# Patient Record
Sex: Male | Born: 1945 | Race: Black or African American | Hispanic: No | Smoking: Never smoker
Health system: Southern US, Community
[De-identification: ages and names within clinical notes are randomized; demographics above are authoritative.]

## PROBLEM LIST (undated history)

## (undated) DIAGNOSIS — E876 Hypokalemia: Secondary | ICD-10-CM

## (undated) DIAGNOSIS — H409 Unspecified glaucoma: Secondary | ICD-10-CM

## (undated) DIAGNOSIS — R319 Hematuria, unspecified: Secondary | ICD-10-CM

## (undated) DIAGNOSIS — E785 Hyperlipidemia, unspecified: Secondary | ICD-10-CM

## (undated) DIAGNOSIS — N529 Male erectile dysfunction, unspecified: Secondary | ICD-10-CM

## (undated) DIAGNOSIS — C679 Malignant neoplasm of bladder, unspecified: Secondary | ICD-10-CM

## (undated) DIAGNOSIS — I10 Essential (primary) hypertension: Secondary | ICD-10-CM

## (undated) HISTORY — PX: EYE SURGERY: SHX253

---

## 1978-01-22 HISTORY — PX: KNEE SURGERY: SHX244

## 2009-01-22 DEATH — deceased

## 2011-11-16 DIAGNOSIS — E785 Hyperlipidemia, unspecified: Secondary | ICD-10-CM | POA: Insufficient documentation

## 2011-11-16 DIAGNOSIS — I10 Essential (primary) hypertension: Secondary | ICD-10-CM | POA: Insufficient documentation

## 2011-12-18 DIAGNOSIS — N529 Male erectile dysfunction, unspecified: Secondary | ICD-10-CM | POA: Insufficient documentation

## 2012-09-22 DEATH — deceased

## 2014-05-28 DIAGNOSIS — H40052 Ocular hypertension, left eye: Secondary | ICD-10-CM | POA: Insufficient documentation

## 2015-03-03 DIAGNOSIS — Z2821 Immunization not carried out because of patient refusal: Secondary | ICD-10-CM | POA: Insufficient documentation

## 2015-03-09 DIAGNOSIS — E876 Hypokalemia: Secondary | ICD-10-CM | POA: Insufficient documentation

## 2016-09-14 ENCOUNTER — Encounter: Payer: Self-pay | Admitting: *Deleted

## 2016-09-17 ENCOUNTER — Ambulatory Visit: Payer: Medicare Other | Admitting: Anesthesiology

## 2016-09-17 ENCOUNTER — Encounter
Admission: RE | Disposition: A | Discharge status: Home / Self Care | Payer: Self-pay | Source: Ambulatory Visit | Attending: Gastroenterology

## 2016-09-17 ENCOUNTER — Encounter: Payer: Self-pay | Admitting: Anesthesiology

## 2016-09-17 ENCOUNTER — Ambulatory Visit
Admission: RE | Admit: 2016-09-17 | Discharge: 2016-09-17 | Disposition: A | Discharge status: Home / Self Care | Payer: Medicare Other | Source: Ambulatory Visit | Attending: Gastroenterology | Admitting: Gastroenterology

## 2016-09-17 DIAGNOSIS — K5730 Diverticulosis of large intestine without perforation or abscess without bleeding: Secondary | ICD-10-CM | POA: Insufficient documentation

## 2016-09-17 DIAGNOSIS — I10 Essential (primary) hypertension: Secondary | ICD-10-CM | POA: Diagnosis not present

## 2016-09-17 DIAGNOSIS — Z86010 Personal history of colonic polyps: Secondary | ICD-10-CM | POA: Diagnosis not present

## 2016-09-17 DIAGNOSIS — K635 Polyp of colon: Secondary | ICD-10-CM | POA: Insufficient documentation

## 2016-09-17 DIAGNOSIS — Z79899 Other long term (current) drug therapy: Secondary | ICD-10-CM | POA: Diagnosis not present

## 2016-09-17 DIAGNOSIS — H409 Unspecified glaucoma: Secondary | ICD-10-CM | POA: Insufficient documentation

## 2016-09-17 DIAGNOSIS — N529 Male erectile dysfunction, unspecified: Secondary | ICD-10-CM | POA: Insufficient documentation

## 2016-09-17 DIAGNOSIS — D124 Benign neoplasm of descending colon: Secondary | ICD-10-CM | POA: Diagnosis not present

## 2016-09-17 DIAGNOSIS — K640 First degree hemorrhoids: Secondary | ICD-10-CM | POA: Insufficient documentation

## 2016-09-17 DIAGNOSIS — Z1211 Encounter for screening for malignant neoplasm of colon: Secondary | ICD-10-CM | POA: Diagnosis not present

## 2016-09-17 HISTORY — PX: COLONOSCOPY WITH PROPOFOL: SHX5780

## 2016-09-17 HISTORY — DX: Hypokalemia: E87.6

## 2016-09-17 HISTORY — DX: Unspecified glaucoma: H40.9

## 2016-09-17 HISTORY — DX: Hyperlipidemia, unspecified: E78.5

## 2016-09-17 HISTORY — DX: Essential (primary) hypertension: I10

## 2016-09-17 HISTORY — DX: Male erectile dysfunction, unspecified: N52.9

## 2016-09-17 SURGERY — COLONOSCOPY WITH PROPOFOL
Anesthesia: General

## 2016-09-17 MED ORDER — PROPOFOL 500 MG/50ML IV EMUL
INTRAVENOUS | Status: DC | PRN
  Administered 2016-09-17: 150 ug/kg/min via INTRAVENOUS

## 2016-09-17 MED ORDER — SODIUM CHLORIDE 0.9 % IV SOLN: INTRAVENOUS | Status: DC

## 2016-09-17 MED ORDER — PROPOFOL 10 MG/ML IV BOLUS
INTRAVENOUS | Status: DC | PRN
  Administered 2016-09-17: 80 mg via INTRAVENOUS

## 2016-09-17 MED ORDER — SODIUM CHLORIDE 0.9 % IV SOLN
INTRAVENOUS | Status: DC
  Administered 2016-09-17 (×2): via INTRAVENOUS

## 2016-09-17 MED ORDER — PROPOFOL 500 MG/50ML IV EMUL
INTRAVENOUS | Status: AC
  Filled 2016-09-17: qty 50

## 2016-09-17 NOTE — Transfer of Care (Signed)
Immediate Anesthesia Transfer of Care Note  Patient: Shawn Hardy  Procedure(s) Performed: Procedure(s): COLONOSCOPY WITH PROPOFOL (N/A)  Patient Location: PACU  Anesthesia Type:General  Level of Consciousness: sedated  Airway & Oxygen Therapy: Patient Spontanous Breathing and Patient connected to nasal cannula oxygen  Post-op Assessment: Report given to RN and Post -op Vital signs reviewed and stable  Post vital signs: Reviewed and stable  Last Vitals:  Vitals:   09/17/16 0913  BP: (!) 161/94  Pulse: 86  Resp: 16  Temp: (!) 36.1 C  SpO2: 98%    Last Pain:  Vitals:   09/17/16 0913  TempSrc: Tympanic         Complications: No apparent anesthesia complications

## 2016-09-17 NOTE — Anesthesia Procedure Notes (Signed)
Date/Time: 09/17/2016 10:15 AM Performed by: Nelda Marseille Pre-anesthesia Checklist: Patient identified, Emergency Drugs available, Suction available, Patient being monitored and Timeout performed Oxygen Delivery Method: Nasal cannula

## 2016-09-17 NOTE — H&P (Signed)
Outpatient short stay form Pre-procedure 09/17/2016 9:56 AM Lollie Sails MD  Primary Physician: Demetrio Lapping, D.O.  Reason for visit:  Colonoscopy  History of present illness:  Patient is a 71 year old male presenting today as above. He tolerated his prep well. He takes no aspirin or blood thinning agents.    Current Facility-Administered Medications:  .  0.9 %  sodium chloride infusion, , Intravenous, Continuous, Lollie Sails, MD, Last Rate: 20 mL/hr at 09/17/16 0929 .  0.9 %  sodium chloride infusion, , Intravenous, Continuous, Lollie Sails, MD  Prescriptions Prior to Admission  Medication Sig Dispense Refill Last Dose  . amLODipine (NORVASC) 10 MG tablet Take 10 mg by mouth daily.   09/16/2016 at Unknown time  . atropine (ATROPINE-CARE) 1 % ophthalmic solution Place 1 drop into the left eye 2 (two) times daily.   09/16/2016 at Unknown time  . benazepril (LOTENSIN) 20 MG tablet Take 20 mg by mouth daily.   09/16/2016 at Unknown time  . brimonidine-timolol (COMBIGAN) 0.2-0.5 % ophthalmic solution Place 1 drop into the right eye 2 (two) times daily.   09/16/2016 at Unknown time  . Difluprednate (DUREZOL) 0.05 % EMUL Place 1 drop into the left eye 2 (two) times daily.   09/16/2016 at Unknown time  . Multiple Vitamins-Minerals (CENTRUM SILVER PO) Take 1 tablet by mouth daily.   Past Week at Unknown time  . potassium chloride (KLOR-CON) 20 MEQ packet Take 20 mEq by mouth 2 (two) times daily.   Past Week at Unknown time  . sildenafil (REVATIO) 20 MG tablet Take 20 mg by mouth as needed. Take 3-4 tablets 2 hours prior to sexual activity.     Marland Kitchen triamterene-hydrochlorothiazide (DYAZIDE) 37.5-25 MG capsule Take 1 capsule by mouth daily.   09/16/2016 at Unknown time     No Known Allergies   Past Medical History:  Diagnosis Date  . ED (erectile dysfunction)   . Glaucoma   . Hyperlipidemia   . Hypertension   . Hypokalemia     Review of systems:      Physical Exam   Heart and lungs: Regular rate and rhythm without rub or gallop, lungs are bilaterally clear.    HEENT: Normocephalic atraumatic eyes are anicteric    Other:     Pertinant exam for procedure: Soft nontender nondistended bowel sounds positive normoactive.    Planned proceedures: Colonoscopy and indicated procedures. I have discussed the risks benefits and complications of procedures to include not limited to bleeding, infection, perforation and the risk of sedation and the patient wishes to proceed.    Lollie Sails, MD Gastroenterology 09/17/2016  9:56 AM

## 2016-09-17 NOTE — Anesthesia Postprocedure Evaluation (Signed)
Anesthesia Post Note  Patient: Shawn Hardy  Procedure(s) Performed: Procedure(s) (LRB): COLONOSCOPY WITH PROPOFOL (N/A)  Patient location during evaluation: PACU Anesthesia Type: General Level of consciousness: awake and alert and oriented Pain management: pain level controlled Vital Signs Assessment: post-procedure vital signs reviewed and stable Respiratory status: spontaneous breathing Cardiovascular status: blood pressure returned to baseline Anesthetic complications: no     Last Vitals:  Vitals:   09/17/16 1057 09/17/16 1107  BP: 125/84 131/79  Pulse: 80 69  Resp: 11 15  Temp:    SpO2: 100% 99%    Last Pain:  Vitals:   09/17/16 1037  TempSrc: Tympanic                 Gianluca Chhim

## 2016-09-17 NOTE — Op Note (Signed)
[facility] Gastroenterology Patient Name: Shawn Hardy Procedure Date: 09/17/2016 10:04 AM MRN: [id number] Account #: [id number] Date of Birth: [birth year] Admit Type: Outpatient Age: 71 Room: [facility] ENDO ROOM 3 Gender: Male Note Status: Finalized Procedure:            Colonoscopy Indications:          Personal history of colonic polyps Providers:            Lollie Sails, MD Referring MD:         Irven Easterly. Kary Kos, MD (Referring MD) Medicines:            Monitored Anesthesia Care Complications:        No immediate complications. Procedure:            Pre-Anesthesia Assessment:                       - ASA Grade Assessment: II - A patient with mild                        systemic disease.                       After obtaining informed consent, the colonoscope was                        passed under direct vision. Throughout the procedure,                        the patient's blood pressure, pulse, and oxygen                        saturations were monitored continuously. The                        Colonoscope was introduced through the anus and                        advanced to the the cecum, identified by appendiceal                        orifice and ileocecal valve. The colonoscopy was                        performed with moderate difficulty due to poor bowel                        prep. Successful completion of the procedure was aided                        by lavage. The quality of the bowel preparation was                        fair. Findings:      Two sessile polyps were found in the descending colon. The polyps were       10 to 11 mm in size. These polyps were removed with a cold snare.       Resection and retrieval were complete.      A 2 mm polyp was found in the mid ascending colon. The polyp was       sessile. The polyp was removed  with a cold biopsy forceps. Resection and       retrieval were complete.      Multiple small-mouthed diverticula  were found in the sigmoid colon,       descending colon, transverse colon and ascending colon.      Non-bleeding internal hemorrhoids were found during retroflexion, during       digital exam and during anoscopy. The hemorrhoids were medium-sized and       Grade I (internal hemorrhoids that do not prolapse). Impression:           - Preparation of the colon was fair.                       - Two 10 to 11 mm polyps in the descending colon,                        removed with a cold snare. Resected and retrieved.                       - One 2 mm polyp in the mid ascending colon, removed                        with a cold biopsy forceps. Resected and retrieved.                       - Diverticulosis in the sigmoid colon, in the                        descending colon, in the transverse colon and in the                        ascending colon.                       - Non-bleeding internal hemorrhoids. Recommendation:       - Await pathology results.                       - Telephone GI clinic for pathology results in 1 week. Procedure Code(s):    --- Professional ---                       [phone], Colonoscopy, flexible; with removal of tumor(s),                        polyp(s), or other lesion(s) by snare technique                       45380, 15, Colonoscopy, flexible; with biopsy, single                        or multiple Diagnosis Code(s):    --- Professional ---                       K64.0, First degree hemorrhoids                       D12.4, Benign neoplasm of descending colon                       D12.2, Benign neoplasm of ascending colon  Z86.010, Personal history of colonic polyps                       K57.30, Diverticulosis of large intestine without                        perforation or abscess without bleeding CPT copyright 2016 American Medical Association. All rights reserved. The codes documented in this report are preliminary and upon coder review may  be  revised to meet current compliance requirements. Lollie Sails, MD 09/17/2016 10:37:12 AM This report has been signed electronically. Number of Addenda: 0 Note Initiated On: 09/17/2016 10:04 AM Scope Withdrawal Time: 0 hours 9 minutes 48 seconds  Total Procedure Duration: 0 hours 20 minutes 43 seconds       [facility]

## 2016-09-17 NOTE — Anesthesia Preprocedure Evaluation (Signed)
Anesthesia Evaluation  Patient identified by MRN, date of birth, ID band Patient awake    Reviewed: Allergy & Precautions, NPO status , Patient's Chart, lab work & pertinent test results  Airway Mallampati: II  TM Distance: >3 FB     Dental  (+) Teeth Intact   Pulmonary neg pulmonary ROS,    Pulmonary exam normal        Cardiovascular hypertension, Pt. on medications Normal cardiovascular exam     Neuro/Psych negative neurological ROS  negative psych ROS   GI/Hepatic negative GI ROS, Neg liver ROS,   Endo/Other  negative endocrine ROS  Renal/GU negative Renal ROS     Musculoskeletal negative musculoskeletal ROS (+)   Abdominal Normal abdominal exam  (+)   Peds negative pediatric ROS (+)  Hematology negative hematology ROS (+)   Anesthesia Other Findings Past Medical History: No date: ED (erectile dysfunction) No date: Glaucoma No date: Hyperlipidemia No date: Hypertension No date: Hypokalemia  Reproductive/Obstetrics                             Anesthesia Physical Anesthesia Plan  ASA: II  Anesthesia Plan: General   Post-op Pain Management:    Induction: Intravenous  PONV Risk Score and Plan:   Airway Management Planned: Nasal Cannula  Additional Equipment:   Intra-op Plan:   Post-operative Plan:   Informed Consent: I have reviewed the patients History and Physical, chart, labs and discussed the procedure including the risks, benefits and alternatives for the proposed anesthesia with the patient or authorized representative who has indicated his/her understanding and acceptance.   Dental advisory given  Plan Discussed with: CRNA and Surgeon  Anesthesia Plan Comments:         Anesthesia Quick Evaluation

## 2016-09-17 NOTE — Anesthesia Post-op Follow-up Note (Signed)
Anesthesia QCDR form completed.        

## 2016-09-18 ENCOUNTER — Encounter: Payer: Self-pay | Admitting: Gastroenterology

## 2016-09-18 LAB — SURGICAL PATHOLOGY

## 2017-09-09 DIAGNOSIS — H2510 Age-related nuclear cataract, unspecified eye: Secondary | ICD-10-CM | POA: Insufficient documentation

## 2017-09-09 DIAGNOSIS — H401190 Primary open-angle glaucoma, unspecified eye, stage unspecified: Secondary | ICD-10-CM | POA: Insufficient documentation

## 2017-09-09 DIAGNOSIS — H179 Unspecified corneal scar and opacity: Secondary | ICD-10-CM | POA: Insufficient documentation

## 2017-09-09 NOTE — Progress Notes (Signed)
09/10/2017 9:07 AM   Shawn Hardy [birth year] [id number]  Referring provider: Maryland Pink, MD [address] [facility] [place], [place] [address]  Chief Complaint  Patient presents with  . Hematuria    HPI: Patient is a 72 -year-old [place] American male who presents today as a referral from Dr. Maryland Pink for gross hematuria.    He states one year ago he was helping his son move heavy stuff and had an episode of gross hematuria.  He states he increased his fluids and it cleared up.    Then in May or June, he had another episode of gross hematuria.  He again increased his fluids and it abated.    It occurred again recently and it is more bloody with clots.  It has not occurred for the last three to four weeks.    He does not have a prior history of recurrent urinary tract infections, nephrolithiasis, trauma to the genitourinary tract, BPH or malignancies of the genitourinary tract.   He does not have a family medical history of nephrolithiasis, malignancies of the genitourinary tract or hematuria.   He is not a smoker.  He is not exposed to secondhand smoke.  He does not work with Sports administrator, trichloroethylene, etc.  He has HTN.  He has a high BMI.   He is drinking soda daily, but he is drinking 4 to 5 bottles of water daily.    He has frequency, nocturia x 1-2, intermittency, straining to urinate and a weak urinary stream.  These are long standing urinary symptoms.  Patient denies any dysuria or suprapubic/flank pain.  Patient denies any fevers, chills, nausea or vomiting.   His UA today is positive for 11-30 RBC's.    PMH: Past Medical History:  Diagnosis Date  . ED (erectile dysfunction)   . Glaucoma   . Hyperlipidemia   . Hypertension   . Hypokalemia     Surgical History: Past Surgical History:  Procedure Laterality Date  . COLONOSCOPY WITH PROPOFOL N/A 09/17/2016   Procedure: COLONOSCOPY WITH PROPOFOL;  Surgeon: Lollie Sails, MD;   Location: [facility] ENDOSCOPY;  Service: Endoscopy;  Laterality: N/A;  . KNEE SURGERY      Home Medications:  Allergies as of 09/10/2017   No Known Allergies     Medication List        Accurate as of 09/10/17  9:07 AM. Always use your most recent med list.          amLODipine 10 MG tablet Commonly known as:  NORVASC Take 10 mg by mouth daily.   ATROPINE-CARE 1 % ophthalmic solution Generic drug:  atropine Place 1 drop into the left eye 2 (two) times daily.   benazepril 20 MG tablet Commonly known as:  LOTENSIN Take 20 mg by mouth daily.   brimonidine 0.2 % ophthalmic solution Commonly known as:  ALPHAGAN INSTILL 1 DROP INTO EACH EYE THREE TIMES DAILY   CENTRUM SILVER PO Take 1 tablet by mouth daily.   COMBIGAN 0.2-0.5 % ophthalmic solution Generic drug:  brimonidine-timolol Place 1 drop into the right eye 2 (two) times daily.   DUREZOL 0.05 % Emul Generic drug:  Difluprednate Place 1 drop into the left eye 2 (two) times daily.   sildenafil 20 MG tablet Commonly known as:  REVATIO Take 20 mg by mouth as needed. Take 3-4 tablets 2 hours prior to sexual activity.   timolol 0.5 % ophthalmic solution Commonly known as:  TIMOPTIC INSTILL 1 DROP  INTO EACH EYE TWICE DAILY   triamterene-hydrochlorothiazide 37.5-25 MG capsule Commonly known as:  DYAZIDE Take 1 capsule by mouth daily.       Allergies: No Known Allergies  Family History: Family History  Problem Relation Age of Onset  . Cirrhosis Father   . Heart attack Mother     Social History:  reports that he has never smoked. He has never used smokeless tobacco. He reports that he drinks alcohol. He reports that he does not use drugs.  ROS: UROLOGY Frequent Urination?: Yes Hard to postpone urination?: No Burning/pain with urination?: No Get up at night to urinate?: Yes Leakage of urine?: No Urine stream starts and stops?: Yes Trouble starting stream?: No Do you have to strain to urinate?: Yes Blood in  urine?: Yes Urinary tract infection?: No Sexually transmitted disease?: No Injury to kidneys or bladder?: No Painful intercourse?: No Weak stream?: Yes Erection problems?: Yes Penile pain?: No  Gastrointestinal Nausea?: No Vomiting?: No Indigestion/heartburn?: No Diarrhea?: No Constipation?: No  Constitutional Fever: No Night sweats?: No Weight loss?: No Fatigue?: Yes  Skin Skin rash/lesions?: No Itching?: Yes  Eyes Blurred vision?: Yes Double vision?: No  Ears/Nose/Throat Sore throat?: No Sinus problems?: No  Hematologic/Lymphatic Swollen glands?: No Easy bruising?: No  Cardiovascular Leg swelling?: No Chest pain?: No  Respiratory Cough?: No Shortness of breath?: No  Endocrine Excessive thirst?: No  Musculoskeletal Back pain?: Yes Joint pain?: No  Neurological Headaches?: No Dizziness?: No  Psychologic Depression?: No Anxiety?: No  Physical Exam: BP (!) 149/82 (BP Location: Left Arm, Patient Position: Sitting, Cuff Size: Normal)   Pulse 78   Ht 5\' 8"  (1.727 m)   Wt 177 lb 3.2 oz (80.4 kg)   BMI 26.94 kg/m   Constitutional:  Well nourished. Alert and oriented, No acute distress. HEENT: [place] AT, moist mucus membranes.  Trachea midline, no masses. Cardiovascular: No clubbing, cyanosis, or edema. Respiratory: Normal respiratory effort, no increased work of breathing. GI: Abdomen is soft, non tender, non distended, no abdominal masses. Liver and spleen not palpable.  No hernias appreciated.  Stool sample for occult testing is not indicated.   GU: No CVA tenderness.  No bladder fullness or masses.  Patient with uncircumcised phallus.  Foreskin easily retracted Urethral meatus is patent.  No penile discharge. No penile lesions or rashes. Scrotum without lesions, cysts, rashes and/or edema.  Testicles are located scrotally bilaterally. No masses are appreciated in the testicles. Left and right epididymis are normal. Rectal: Patient with  normal  sphincter tone. Anus and perineum without scarring or rashes. No rectal masses are appreciated. Prostate is approximately 60 grams, could only palpate the apex, no nodules are appreciated. Seminal vesicles are normal. Skin: No rashes, bruises or suspicious lesions. Lymph: No cervical or inguinal adenopathy. Neurologic: Grossly intact, no focal deficits, moving all 4 extremities. Psychiatric: Normal mood and affect.  Laboratory Data: PSA Trend  0.9 in June 2008  0.8 in October 2009  1.3 in September 2011  1.18 in November 2012  1.07 in October 2013  1.44 in December 2014  1.10 in December 2015  3.27 in November 2017  2.08 in February 2018  2.58 in March 2019 No results found for: WBC, HGB, HCT, MCV, PLT  No results found for: CREATININE  No results found for: PSA  No results found for: TESTOSTERONE  No results found for: HGBA1C  No results found for: TSH  No results found for: CHOL, HDL, CHOLHDL, VLDL, LDLCALC  No results found for:  AST No results found for: ALT No components found for: ALKALINEPHOPHATASE No components found for: BILIRUBINTOTAL  No results found for: ESTRADIOL   Urinalysis 11-30 RBC's.  See Epic and HPI.   I have reviewed the labs.  Assessment & Plan:   1. Gross hematuria Explained to the patient that there are a number of causes that can be associated with blood in the urine, such as stones, BPH, UTI's, damage to the urinary tract and/or cancer. At this time, I felt that the patient warranted further urologic evaluation.   The AUA guidelines state that a CT urogram is the preferred imaging study to evaluate hematuria. I explained to the patient that a contrast material will be injected into a vein and that in rare instances, an allergic reaction can result and may even life threatening   The patient denies any allergies to contrast, iodine and/or seafood and is not taking metformin. Following the imaging study,  I've recommended a cystoscopy. I  described how this is performed, typically in an office setting with a flexible cystoscope. We described the risks, benefits, and possible side effects, the most common of which is a minor amount of blood in the urine and/or burning which usually resolves in 24 to 48 hours.   The patient had the opportunity to ask questions which were answered. Based upon this discussion, the patient is willing to proceed. Therefore, I've ordered: a CT Urogram and cystoscopy.  The patient will return following all of the above for discussion of the results.  UA  Urine culture  BUN + creatinine    2. BPH with LU TS Patient attributes his luminary urinary tract symptoms to the increase of water that he takes in daily  Patient's PSA jumped from 1.10 in 2015 to 3.27 in 2017, he states the reason for the gap in the PSA is when he was searching for a new primary care physician and he did not have a yearly physical at that time The PSA appears to have stabilized at this time - continue with yearly exam's and PSA  Return for CT Urogram report and cystoscopy.  These notes generated with voice recognition software. I apologize for typographical errors.  Zara Council, PA-C  [facility] [address] [place], [place] [address] [phone]

## 2017-09-10 ENCOUNTER — Ambulatory Visit: Payer: Medicare Other | Admitting: Urology

## 2017-09-10 ENCOUNTER — Encounter: Payer: Self-pay | Admitting: Urology

## 2017-09-10 VITALS — BP 149/82 | HR 78 | Ht 68.0 in | Wt 177.2 lb

## 2017-09-10 DIAGNOSIS — R310 Gross hematuria: Secondary | ICD-10-CM | POA: Diagnosis not present

## 2017-09-10 DIAGNOSIS — N138 Other obstructive and reflux uropathy: Secondary | ICD-10-CM | POA: Diagnosis not present

## 2017-09-10 DIAGNOSIS — N401 Enlarged prostate with lower urinary tract symptoms: Secondary | ICD-10-CM | POA: Diagnosis not present

## 2017-09-10 LAB — MICROSCOPIC EXAMINATION: WBC UA: NONE SEEN /HPF (ref 0–5)

## 2017-09-10 LAB — URINALYSIS, COMPLETE
BILIRUBIN UA: NEGATIVE
GLUCOSE, UA: NEGATIVE
KETONES UA: NEGATIVE
Leukocytes, UA: NEGATIVE
NITRITE UA: NEGATIVE
SPEC GRAV UA: 1.02 (ref 1.005–1.030)
UUROB: 0.2 mg/dL (ref 0.2–1.0)
pH, UA: 6 (ref 5.0–7.5)

## 2017-09-11 LAB — BUN+CREAT
BUN/Creatinine Ratio: 12 (ref 10–24)
BUN: 18 mg/dL (ref 8–27)
Creatinine, Ser: 1.54 mg/dL — ABNORMAL HIGH (ref 0.76–1.27)
GFR calc Af Amer: 51 mL/min/{1.73_m2} — ABNORMAL LOW (ref >59)
GFR calc non Af Amer: 44 mL/min/{1.73_m2} — ABNORMAL LOW (ref >59)

## 2017-09-13 LAB — CULTURE, URINE COMPREHENSIVE

## 2017-09-24 ENCOUNTER — Ambulatory Visit
Admission: RE | Admit: 2017-09-24 | Discharge: 2017-09-24 | Disposition: A | Discharge status: Home / Self Care | Payer: Medicare Other | Source: Ambulatory Visit | Attending: Urology | Admitting: Urology

## 2017-09-24 DIAGNOSIS — K8020 Calculus of gallbladder without cholecystitis without obstruction: Secondary | ICD-10-CM | POA: Diagnosis not present

## 2017-09-24 DIAGNOSIS — R310 Gross hematuria: Secondary | ICD-10-CM | POA: Diagnosis present

## 2017-09-24 DIAGNOSIS — I700 Atherosclerosis of aorta: Secondary | ICD-10-CM | POA: Insufficient documentation

## 2017-09-24 DIAGNOSIS — I2510 Atherosclerotic heart disease of native coronary artery without angina pectoris: Secondary | ICD-10-CM | POA: Diagnosis not present

## 2017-09-24 MED ORDER — IOPAMIDOL (ISOVUE-300) INJECTION 61%
125.0000 mL | Freq: Once | INTRAVENOUS | Status: AC | PRN
  Administered 2017-09-24: 125 mL via INTRAVENOUS

## 2017-09-30 ENCOUNTER — Other Ambulatory Visit: Payer: Medicare Other | Admitting: Urology

## 2017-10-15 ENCOUNTER — Ambulatory Visit: Payer: Medicare Other | Admitting: Urology

## 2017-10-15 ENCOUNTER — Encounter: Payer: Self-pay | Admitting: Urology

## 2017-10-15 VITALS — BP 171/94 | HR 76 | Ht 68.0 in | Wt 173.0 lb

## 2017-10-15 DIAGNOSIS — R310 Gross hematuria: Secondary | ICD-10-CM | POA: Diagnosis not present

## 2017-10-15 LAB — URINALYSIS, COMPLETE
Bilirubin, UA: NEGATIVE
Glucose, UA: NEGATIVE
KETONES UA: NEGATIVE
LEUKOCYTES UA: NEGATIVE
Nitrite, UA: NEGATIVE
SPEC GRAV UA: 1.015 (ref 1.005–1.030)
Urobilinogen, Ur: 0.2 mg/dL (ref 0.2–1.0)
pH, UA: 7 (ref 5.0–7.5)

## 2017-10-15 LAB — MICROSCOPIC EXAMINATION

## 2017-10-15 MED ORDER — LIDOCAINE HCL URETHRAL/MUCOSAL 2 % EX GEL: 1.0000 "application " | Freq: Once | CUTANEOUS | Status: DC

## 2017-10-15 MED ORDER — SULFAMETHOXAZOLE-TRIMETHOPRIM 800-160 MG PO TABS: 1.0000 | ORAL_TABLET | Freq: Two times a day (BID) | ORAL | 0 refills | Status: AC

## 2017-10-15 NOTE — Progress Notes (Signed)
   10/15/2017 11:31 AM   Kandee Keen 08-13-1945 [id number]  Reason for visit: Follow up gross hematuria  HPI: I had the pleasure of seeing Mr. Bublitz in urology clinic today.  He was initially scheduled for a cystoscopy to complete a hematuria work-up today, however his CT urogram shows numerous obvious large bladder tumors, and his urine shows possible infection.  He reports over 1 year of intermittent gross hematuria including passage of clots.   ROS: Please see flowsheet from today's date for complete review of systems.  Physical Exam: BP (!) 171/94   Pulse 76   Ht 5\' 8"  (1.727 m)   Wt 173 lb (78.5 kg)   BMI 26.30 kg/m    Constitutional:  Alert and oriented, No acute distress. Respiratory: Normal respiratory effort, no increased work of breathing. GI: Abdomen is soft, nontender, nondistended, no abdominal masses GU: No CVA tenderness Skin: No rashes, bruises or suspicious lesions. Neurologic: Grossly intact, no focal deficits, moving all 4 extremities. Psychiatric: Normal mood and affect   Pertinent Imaging:  I have personally reviewed the CT urogram dated 09/24/2017.  There are numerous soft tissue masses in the bladder concerning for bladder cancer.  The largest is 5 cm and is located at the left base of the bladder.  There is no hydronephrosis.  No metastatic disease outside the bladder on the CT scan.  No upper tract filling defects.  Assessment & Plan:   In summary, Mr. Balkcom is a relatively healthy 72 year old male with over a year of gross hematuria, and CT urogram demonstrating numerous soft tissue bladder lesions concerning for bladder cancer, the largest is approximately 5 cm and located at the left base of the bladder.  There is no obvious metastatic disease or hydronephrosis.  We had a very long conversation today about his CT findings with bladder masses consistent with bladder cancer.  We discussed the next step is tissue sampling and resection with transurethral  resection of bladder tumor.  This is typically an outpatient procedure, with a small risk of bleeding or infection, or bladder perforation.  He may need a catheter for 2 to 3 days postoperatively depending on the extent of resection.  This is important to determine stage and grade and determine the next steps in his care.  We briefly discussed surveillance of Ta and T1 tumors, and possible re-resection.  We also discussed briefly management of muscle invasive bladder cancer with neoadjuvant chemotherapy followed by likely cystectomy and possible urinary diversion.  10 days bactrim BID, send urine for culture Schedule TURBT, re-check urine prior to surgery to confirm sterilization  Please note, greater than 25 minutes were spent in direct patient education and counseling regarding new diagnosis of likely bladder cancer.  No follow-ups on file.  Billey Co, [facility] [address], [place],  [address] [phone]

## 2017-10-15 NOTE — Patient Instructions (Signed)
Transurethral Resection of Bladder Tumor Transurethral resection of a bladder tumor is the removal (resection) of a cancerous growth (tumor) on the inside wall of the bladder. The bladder is the organ that holds urine. The tumor is removed through the tube that carries urine out of the body (urethra). In a transurethral resection, a thin telescope with a light, a tiny camera, and an electric cutting edge (resectoscope) is passed through the urethra. In men, the opening of the urethra is at the end of the penis. In women, it is just above the vaginal opening. Tell a health care provider about:  Any allergies you have.  All medicines you are taking, including vitamins, herbs, eye drops, creams, and over-the-counter medicines.  Any problems you or family members have had with anesthetic medicines.  Any blood disorders you have.  Any surgeries you have had.  Any medical conditions you have.  Any recent urinary tract infections you have had.  Whether you are pregnant or may be pregnant. What are the risks? Generally, this is a safe procedure. However, problems may occur, including:  Infection.  Bleeding.  Allergic reactions to medicines.  Damage to other structures or organs, such as: ? The urethra. ? The tubes that drain urine from the kidneys into the bladder (ureters).  Pain and burning during urination.  Difficulty urinating due to partial blockage of the urethra.  Inability to urinate (urinary retention).  What happens before the procedure?  Follow instructions from your health care provider about eating and drinking restrictions.  Ask your health care provider about: ? Changing or stopping your regular medicines. This is especially important if you are taking diabetes medicines or blood thinners. ? Taking medicines such as aspirin and ibuprofen. These medicines can thin your blood. Do not take these medicines before your procedure if your health care provider instructs  you not to.  You may have a physical exam.  You may have tests, including: ? Blood tests. ? Urine tests. ? Electrocardiogram (ECG). This test measures the electrical activity of the heart.  You may be given antibiotic medicine to help prevent infection.  Ask your health care provider how your surgical site will be marked or identified.  Plan to have someone take you home after the procedure. What happens during the procedure?  To reduce your risk of infection: ? Your health care team will wash or sanitize their hands. ? Your skin will be washed with soap.  An IV tube will be inserted into one of your veins.  You will be given one or more of the following: ? A medicine to help you relax (sedative). ? A medicine to make you fall asleep (general anesthetic). ? A medicine that is injected into your spine to numb the area below and slightly above the injection site (spinal anesthetic).  Your legs will be placed in foot rests (stirrups) so that your legs are apart and your knees are bent.  The resectoscope will be passed through your urethra and into your bladder.  The part of your bladder that is affected by the tumor will be resected using the cutting edge of the resectoscope.  The resectoscope will be removed.  A thin, flexible tube (catheter) will be passed through your urethra and into your bladder. The catheter will drain urine into a bag outside of your body. ? Fluid may be passed through the catheter to keep the catheter open. The procedure may vary among health care providers and hospitals. What happens after the procedure?  Your blood pressure, heart rate, breathing rate, and blood oxygen level will be monitored often until the medicines you were given have worn off.  You may continue to receive fluids and medicines through an IV tube.  You will have some pain. Pain medicine will be available to help you.  You will have a catheter draining your urine. ? You will  have blood in your urine. Your catheter may be kept in until your urine is clear. ? Your urinary drainage will be monitored. If necessary, your bladder may be rinsed out (irrigated) by passing fluid through your catheter.  You will be encouraged to walk around as soon as possible.  You may have to wear compression stockings. These stockings help to prevent blood clots and reduce swelling in your legs.  Do not drive for 24 hours if you received a sedative. This information is not intended to replace advice given to you by your health care provider. Make sure you discuss any questions you have with your health care provider. Document Released: 11/04/2008 Document Revised: 09/11/2015 Document Reviewed: 09/30/2014 Elsevier Interactive Patient Education  2018 [employer].

## 2017-10-17 ENCOUNTER — Telehealth: Payer: Self-pay

## 2017-10-17 NOTE — Telephone Encounter (Signed)
ERROR

## 2017-10-18 LAB — CULTURE, URINE COMPREHENSIVE

## 2017-10-21 ENCOUNTER — Other Ambulatory Visit: Payer: Self-pay | Admitting: Radiology

## 2017-10-21 DIAGNOSIS — D494 Neoplasm of unspecified behavior of bladder: Secondary | ICD-10-CM

## 2017-10-24 ENCOUNTER — Other Ambulatory Visit: Payer: Self-pay

## 2017-10-24 ENCOUNTER — Encounter
Admission: RE | Admit: 2017-10-24 | Discharge: 2017-10-24 | Disposition: A | Discharge status: Home / Self Care | Payer: Medicare Other | Source: Ambulatory Visit | Attending: Urology | Admitting: Urology

## 2017-10-24 DIAGNOSIS — D494 Neoplasm of unspecified behavior of bladder: Secondary | ICD-10-CM | POA: Diagnosis not present

## 2017-10-24 DIAGNOSIS — Z01818 Encounter for other preprocedural examination: Secondary | ICD-10-CM | POA: Diagnosis present

## 2017-10-24 HISTORY — DX: Hematuria, unspecified: R31.9

## 2017-10-24 NOTE — Patient Instructions (Signed)
Your procedure is scheduled on: Friday, October 11th  Report to [facility]    DO NOT STOP ON THE FIRST FLOOR TO REGISTER  To find out your arrival time please call [phone] between 1PM - 3PM on Thursday, October 10th  Remember: Instructions that are not followed completely may result in serious medical risk,  up to and including death, or upon the discretion of your surgeon and anesthesiologist your  surgery may need to be rescheduled.     _X__ 1. Do not eat food after midnight the night before your procedure.                 No gum chewing or hard candies.                    ABSOLUTELY NOTHING SOLID IN YOUR MOUTH AFTER MIDNIGHT THURSDAY                  You may drink clear liquids up to 2 hours before you are scheduled to arrive for your surgery-                  DO not drink clear liquids within 2 hours of the start of your surgery.                  Clear Liquids include:  water, apple juice without pulp, clear carbohydrate                 drink such as Clearfast of Gatorade, Black Coffee or Tea (Do not add                 anything to coffee or tea).  __X__2.  On the morning of surgery brush your teeth with toothpaste and water,                  You may rinse your mouth with mouthwash if you wish.                     Do not swallow any toothpaste of mouthwash.     _X__ 3.  No Alcohol for 24 hours before or after surgery.   _X__ 4.  Do Not Smoke or use e-cigarettes For 24 Hours Prior to Your Surgery.                 Do not use any chewable tobacco products for at least 6 hours prior to                 surgery.  ____  5.  Bring all medications with you on the day of surgery if instructed.   ____  6.  Notify your doctor if there is any change in your medical condition      (cold, fever, infections).     Do not wear jewelry, make-up, hairpins, clips or nail polish. Do not wear lotions, powders, or perfumes. You may wear  deodorant. Do not shave 48 hours prior to surgery. Men may shave face and neck. Do not bring valuables to the hospital.    [facility] is not responsible for any belongings or valuables.  Contacts, dentures or bridgework may not be worn into surgery. Leave your suitcase in the car. After surgery it may be brought to your room. For patients admitted to the hospital, discharge time is determined by your treatment team.   Patients discharged the day of surgery will not  be allowed to drive home.   Please read over the following fact sheets that you were given:   PREPARING FOR SURGERY   __X__ Take these medicines the morning of surgery with A SIP OF WATER:    1. NORVASC(AMLODIPINE)  2. LUMIGAN EYE DROPS  3. ALPHAGAN EYE DROPS  4. TIMOLOL EYE DROPS  5.  6.  ____ Fleet Enema (as directed)   __X  TAKE A SHOWER EITHER THE NIGHT BEFORE SURGERY OR THE MORNING OF SURGERY             USING AN ANTIBACTERIAL SOAP  ____ Use inhalers on the day of surgery  _X___ Stop ALL ASPIRIN PRODUCTS AS OF TODAY              THIS INCLUDES VANQUISH  __X__ Stop Anti-inflammatories AS OF TODAY   __X__ Stop supplements until after surgery.                 THIS INCLUDES MULTIVITAMINS  ____ Bring C-Pap to the hospital.   CONTINUE TO TAKE:       LOTENSIN      TRIAMTERENE-HYDROCHLOROTHIAZIDE      POTASSIUM SUPPLEMENT               BUT,   DO NOT TAKE THESE 3 MEDICATIONS ON THE MORNING OF SURGERY   WEAR LOOSE AND COMFORTABLE CLOTHES TO [facility]  HAVE STOOL SOFTENERS AT HOME TO TAKE ONCE SURGERY COMPLETED.      YOU MAY START THE DAY BEFORE SURGERY  PLEASE BRING A COPY OF YOUR MEDICAL ADVANCE DIRECTIVES SO WE MAY            PUT A COPY IN YOUR CHART

## 2017-10-25 LAB — URINE CULTURE: Culture: NO GROWTH

## 2017-10-29 ENCOUNTER — Telehealth: Payer: Self-pay | Admitting: Urology

## 2017-10-29 NOTE — Telephone Encounter (Signed)
Pt called with some questions for you.  Please give him a call [phone]

## 2017-10-29 NOTE — Telephone Encounter (Signed)
LMOM to call back

## 2017-10-31 MED ORDER — CIPROFLOXACIN IN D5W 400 MG/200ML IV SOLN
400.0000 mg | INTRAVENOUS | Status: AC
  Administered 2017-11-01: 400 mg via INTRAVENOUS

## 2017-11-01 ENCOUNTER — Encounter
Admission: RE | Disposition: A | Discharge status: Home / Self Care | Payer: Self-pay | Source: Ambulatory Visit | Attending: Urology

## 2017-11-01 ENCOUNTER — Ambulatory Visit
Admission: RE | Admit: 2017-11-01 | Discharge: 2017-11-01 | Disposition: A | Discharge status: Home / Self Care | Payer: Medicare Other | Source: Ambulatory Visit | Attending: Urology | Admitting: Urology

## 2017-11-01 ENCOUNTER — Ambulatory Visit: Payer: Medicare Other | Admitting: Registered Nurse

## 2017-11-01 DIAGNOSIS — C678 Malignant neoplasm of overlapping sites of bladder: Secondary | ICD-10-CM | POA: Diagnosis not present

## 2017-11-01 DIAGNOSIS — Z79899 Other long term (current) drug therapy: Secondary | ICD-10-CM | POA: Insufficient documentation

## 2017-11-01 DIAGNOSIS — I10 Essential (primary) hypertension: Secondary | ICD-10-CM | POA: Diagnosis not present

## 2017-11-01 DIAGNOSIS — H409 Unspecified glaucoma: Secondary | ICD-10-CM | POA: Diagnosis not present

## 2017-11-01 DIAGNOSIS — D494 Neoplasm of unspecified behavior of bladder: Secondary | ICD-10-CM

## 2017-11-01 HISTORY — PX: TRANSURETHRAL RESECTION OF BLADDER TUMOR: SHX2575

## 2017-11-01 SURGERY — TURBT (TRANSURETHRAL RESECTION OF BLADDER TUMOR)
Anesthesia: General

## 2017-11-01 MED ORDER — ONDANSETRON HCL 4 MG/2ML IJ SOLN
INTRAMUSCULAR | Status: AC
  Filled 2017-11-01: qty 2

## 2017-11-01 MED ORDER — DEXAMETHASONE SODIUM PHOSPHATE 10 MG/ML IJ SOLN
INTRAMUSCULAR | Status: DC | PRN
  Administered 2017-11-01: 4 mg via INTRAVENOUS

## 2017-11-01 MED ORDER — FENTANYL CITRATE (PF) 100 MCG/2ML IJ SOLN
INTRAMUSCULAR | Status: AC
  Administered 2017-11-01: 25 ug via INTRAVENOUS
  Filled 2017-11-01: qty 2

## 2017-11-01 MED ORDER — ROCURONIUM BROMIDE 100 MG/10ML IV SOLN
INTRAVENOUS | Status: DC | PRN
  Administered 2017-11-01 (×2): 10 mg via INTRAVENOUS
  Administered 2017-11-01: 50 mg via INTRAVENOUS

## 2017-11-01 MED ORDER — PROMETHAZINE HCL 25 MG/ML IJ SOLN: 6.2500 mg | INTRAMUSCULAR | Status: DC | PRN

## 2017-11-01 MED ORDER — FAMOTIDINE 20 MG PO TABS
ORAL_TABLET | ORAL | Status: AC
  Administered 2017-11-01: 20 mg via ORAL
  Filled 2017-11-01: qty 1

## 2017-11-01 MED ORDER — HYDROCODONE-ACETAMINOPHEN 5-325 MG PO TABS: 1.0000 | ORAL_TABLET | ORAL | 0 refills | Status: AC | PRN

## 2017-11-01 MED ORDER — LIDOCAINE HCL (PF) 2 % IJ SOLN
INTRAMUSCULAR | Status: AC
  Filled 2017-11-01: qty 10

## 2017-11-01 MED ORDER — FENTANYL CITRATE (PF) 100 MCG/2ML IJ SOLN
INTRAMUSCULAR | Status: AC
  Filled 2017-11-01: qty 2

## 2017-11-01 MED ORDER — PHENYLEPHRINE HCL 10 MG/ML IJ SOLN
INTRAMUSCULAR | Status: AC
  Filled 2017-11-01: qty 1

## 2017-11-01 MED ORDER — GLYCOPYRROLATE 0.2 MG/ML IJ SOLN
INTRAMUSCULAR | Status: DC | PRN
  Administered 2017-11-01 (×2): 0.1 mg via INTRAVENOUS

## 2017-11-01 MED ORDER — MIDAZOLAM HCL 2 MG/2ML IJ SOLN
INTRAMUSCULAR | Status: AC
  Filled 2017-11-01: qty 2

## 2017-11-01 MED ORDER — OXYCODONE HCL 5 MG PO TABS
5.0000 mg | ORAL_TABLET | Freq: Once | ORAL | Status: AC | PRN
  Administered 2017-11-01: 5 mg via ORAL

## 2017-11-01 MED ORDER — DEXAMETHASONE SODIUM PHOSPHATE 10 MG/ML IJ SOLN
INTRAMUSCULAR | Status: AC
  Filled 2017-11-01: qty 1

## 2017-11-01 MED ORDER — ONDANSETRON HCL 4 MG/2ML IJ SOLN
INTRAMUSCULAR | Status: DC | PRN
  Administered 2017-11-01: 4 mg via INTRAVENOUS

## 2017-11-01 MED ORDER — ROCURONIUM BROMIDE 50 MG/5ML IV SOLN
INTRAVENOUS | Status: AC
  Filled 2017-11-01: qty 1

## 2017-11-01 MED ORDER — MEPERIDINE HCL 50 MG/ML IJ SOLN: 6.2500 mg | INTRAMUSCULAR | Status: DC | PRN

## 2017-11-01 MED ORDER — PROPOFOL 10 MG/ML IV BOLUS
INTRAVENOUS | Status: DC | PRN
  Administered 2017-11-01: 140 mg via INTRAVENOUS

## 2017-11-01 MED ORDER — PROPOFOL 10 MG/ML IV BOLUS
INTRAVENOUS | Status: AC
  Filled 2017-11-01: qty 40

## 2017-11-01 MED ORDER — LACTATED RINGERS IV SOLN
INTRAVENOUS | Status: DC
  Administered 2017-11-01: 07:00:00 via INTRAVENOUS

## 2017-11-01 MED ORDER — LIDOCAINE HCL (CARDIAC) PF 100 MG/5ML IV SOSY
PREFILLED_SYRINGE | INTRAVENOUS | Status: DC | PRN
  Administered 2017-11-01: 80 mg via INTRAVENOUS

## 2017-11-01 MED ORDER — MIDAZOLAM HCL 2 MG/2ML IJ SOLN
INTRAMUSCULAR | Status: DC | PRN
  Administered 2017-11-01: 2 mg via INTRAVENOUS

## 2017-11-01 MED ORDER — HYDROCODONE-ACETAMINOPHEN 5-325 MG PO TABS: 1.0000 | ORAL_TABLET | ORAL | Status: DC | PRN

## 2017-11-01 MED ORDER — FENTANYL CITRATE (PF) 100 MCG/2ML IJ SOLN
INTRAMUSCULAR | Status: DC | PRN
  Administered 2017-11-01 (×2): 50 ug via INTRAVENOUS

## 2017-11-01 MED ORDER — FAMOTIDINE 20 MG PO TABS
20.0000 mg | ORAL_TABLET | Freq: Once | ORAL | Status: AC
  Administered 2017-11-01: 20 mg via ORAL

## 2017-11-01 MED ORDER — SUCCINYLCHOLINE CHLORIDE 20 MG/ML IJ SOLN
INTRAMUSCULAR | Status: AC
  Filled 2017-11-01: qty 1

## 2017-11-01 MED ORDER — SUGAMMADEX SODIUM 200 MG/2ML IV SOLN
INTRAVENOUS | Status: DC | PRN
  Administered 2017-11-01: 160 mg via INTRAVENOUS

## 2017-11-01 MED ORDER — SUGAMMADEX SODIUM 200 MG/2ML IV SOLN
INTRAVENOUS | Status: AC
  Filled 2017-11-01: qty 2

## 2017-11-01 MED ORDER — FENTANYL CITRATE (PF) 100 MCG/2ML IJ SOLN
25.0000 ug | INTRAMUSCULAR | Status: DC | PRN
  Administered 2017-11-01 (×2): 25 ug via INTRAVENOUS

## 2017-11-01 MED ORDER — GLYCOPYRROLATE 0.2 MG/ML IJ SOLN
INTRAMUSCULAR | Status: AC
  Filled 2017-11-01: qty 1

## 2017-11-01 MED ORDER — CIPROFLOXACIN IN D5W 400 MG/200ML IV SOLN
INTRAVENOUS | Status: AC
  Filled 2017-11-01: qty 200

## 2017-11-01 MED ORDER — OXYCODONE HCL 5 MG/5ML PO SOLN: 5.0000 mg | Freq: Once | ORAL | Status: AC | PRN

## 2017-11-01 MED ORDER — GEMCITABINE CHEMO FOR BLADDER INSTILLATION 2000 MG
2000.0000 mg | Freq: Once | INTRAVENOUS | Status: DC
  Filled 2017-11-01: qty 52.6

## 2017-11-01 MED ORDER — OXYCODONE HCL 5 MG PO TABS
ORAL_TABLET | ORAL | Status: AC
  Administered 2017-11-01: 5 mg via ORAL
  Filled 2017-11-01: qty 1

## 2017-11-01 SURGICAL SUPPLY — 31 items
BAG DRAIN CYSTO-URO LG1000N (MISCELLANEOUS) ×3 IMPLANT
BAG URINE DRAINAGE (UROLOGICAL SUPPLIES) ×3 IMPLANT
BRUSH SCRUB EZ  4% CHG (MISCELLANEOUS) ×2
BRUSH SCRUB EZ 4% CHG (MISCELLANEOUS) ×1 IMPLANT
CATH FOL 2WAY LX 22X30 (CATHETERS) ×2 IMPLANT
CATH FOLEY 2WAY  5CC 16FR (CATHETERS) ×2
CATH URTH 16FR FL 2W BLN LF (CATHETERS) ×1 IMPLANT
CRADLE LAMINECT ARM (MISCELLANEOUS) ×3 IMPLANT
DRAPE UTILITY 15X26 TOWEL STRL (DRAPES) ×3 IMPLANT
DRSG TELFA 4X3 1S NADH ST (GAUZE/BANDAGES/DRESSINGS) ×3 IMPLANT
ELECT BIVAP BIPO 22/24 DONUT (ELECTROSURGICAL) ×3
ELECT LOOP 22F BIPOLAR SML (ELECTROSURGICAL)
ELECT REM PT RETURN 9FT ADLT (ELECTROSURGICAL)
ELECTRD BIVAP BIPO 22/24 DONUT (ELECTROSURGICAL) IMPLANT
ELECTRODE LOOP 22F BIPOLAR SML (ELECTROSURGICAL) IMPLANT
ELECTRODE REM PT RTRN 9FT ADLT (ELECTROSURGICAL) IMPLANT
GLOVE BIO SURGEON STRL SZ 6.5 (GLOVE) ×2 IMPLANT
GLOVE BIO SURGEONS STRL SZ 6.5 (GLOVE) ×1
GLOVE INDICATOR 7.5 STRL GRN (GLOVE) ×2 IMPLANT
GOWN STRL REUS W/ TWL LRG LVL3 (GOWN DISPOSABLE) ×2 IMPLANT
GOWN STRL REUS W/TWL LRG LVL3 (GOWN DISPOSABLE) ×4
IV NS IRRIG 3000ML ARTHROMATIC (IV SOLUTION) ×26 IMPLANT
KIT TURNOVER CYSTO (KITS) ×3 IMPLANT
LOOP CUT BIPOLAR 24F LRG (ELECTROSURGICAL) ×2 IMPLANT
NDL SAFETY ECLIPSE 18X1.5 (NEEDLE) ×1 IMPLANT
NEEDLE HYPO 18GX1.5 SHARP (NEEDLE)
PACK CYSTO AR (MISCELLANEOUS) ×3 IMPLANT
SET IRRIG Y TYPE TUR BLADDER L (SET/KITS/TRAYS/PACK) ×3 IMPLANT
SURGILUBE 2OZ TUBE FLIPTOP (MISCELLANEOUS) ×3 IMPLANT
SYRINGE IRR TOOMEY STRL 70CC (SYRINGE) ×3 IMPLANT
WATER STERILE IRR 1000ML POUR (IV SOLUTION) ×3 IMPLANT

## 2017-11-01 NOTE — Anesthesia Procedure Notes (Signed)
Procedure Name: Intubation Performed by: Lia Foyer, RN Pre-anesthesia Checklist: Patient identified, Patient being monitored, Timeout performed, Emergency Drugs available and Suction available Patient Re-evaluated:Patient Re-evaluated prior to induction Oxygen Delivery Method: Circle system utilized Preoxygenation: Pre-oxygenation with 100% oxygen Induction Type: IV induction Ventilation: Mask ventilation without difficulty Laryngoscope Size: Mac and 4 Grade View: Grade I Tube type: Oral Tube size: 7.5 mm Number of attempts: 1 Airway Equipment and Method: Stylet Placement Confirmation: ETT inserted through vocal cords under direct vision,  positive ETCO2 and breath sounds checked- equal and bilateral Secured at: 22 cm Tube secured with: Tape Dental Injury: Teeth and Oropharynx as per pre-operative assessment

## 2017-11-01 NOTE — Anesthesia Post-op Follow-up Note (Signed)
Anesthesia QCDR form completed.        

## 2017-11-01 NOTE — Op Note (Signed)
Date of procedure: 11/01/17  Preoperative diagnosis:  1. Bladder tumor, large, greater than 5 cm  Postoperative diagnosis:  1. Same  Procedure: 1. Transurethral resection of bladder tumor  Surgeon: Nickolas Madrid, MD  Anesthesia: General  Complications: None  Intraoperative findings:  1.  Normal urethra, moderate size prostate 2.  Extensive papillary appearing tumor in the bladder.  The largest mass was on the left lateral wall from the ureteral orifice wrapping laterally all the way up to the dome 3.  Large anterior tumor 4.  Additional small papillary tumors at the right lateral posterior wall  EBL: 25 cc  Specimens: Bladder tumor  Drains: 22 French two-way Foley  Indication: Shawn Hardy is a 72 y.o. patient with gross hematuria who underwent CT urogram that demonstrated multiple large masses within the bladder.  After reviewing the management options for treatment, they elected to proceed with the above surgical procedure(s). We have discussed the potential benefits and risks of the procedure, side effects of the proposed treatment, the likelihood of the patient achieving the goals of the procedure, and any potential problems that might occur during the procedure or recuperation. Informed consent has been obtained.  Description of procedure:  The patient was taken to the operating room and general anesthesia was induced.  The patient was placed in the dorsal lithotomy position, prepped and draped in the usual sterile fashion, and preoperative antibiotics(Cipro) were administered. A preoperative time-out was performed.   A 21 French rigid cystoscope with a 30 degree lens was used to intubate the urethra.  Normal-appearing urethra was followed proximally into the bladder.  The prostate was moderate in size.  Upon entering the bladder we could immediately see large papillary tumor tripping over the bladder neck.  I was able to identify the ureteral orifice ease bilaterally, the left  ureteral orifice was in very close proximity to a large bladder tumor.  The tumors were papillary appearing.  There was greater than 5 cm on the left lateral wall that wrapped anteriorly across the anterior wall and dome.  There were also multiple lesions on the right posterior wall of the bladder.  A 26 French resectoscope was used into the bladder using the visual obturator.  The bipolar large resecting loop was used to methodically resect this tumor down to the muscle layer of the bladder.  The bladder tumor at the anterior wall was very difficult to resect and required significant amount of pressure on the lower abdomen to bring the tumor into view.  When we had resected all visible tumor, we used the bipolar button to obtain excellent hemostasis.  At the conclusion there was no active bleeding noted.  There was no evidence of significant perforation.  I did not instill intravesical chemotherapy postoperatively secondary to his extensive extent of disease and my suspicion for high-grade or even muscle invasive disease.  At the conclusion of the case I was able to clearly identify both ureteral orifices.  A 22 [place] two-way Foley was passed easily into the bladder with return of light watermelon colored urine.  15 cc were placed in the balloon  Disposition: Stable to PACU  Plan: Follow-up in clinic in 1 week to discuss pathology and for voiding trial  Nickolas Madrid, MD

## 2017-11-01 NOTE — Transfer of Care (Signed)
Immediate Anesthesia Transfer of Care Note  Patient: Shawn Hardy  Procedure(s) Performed: TRANSURETHRAL RESECTION OF BLADDER TUMOR (TURBT) (N/A )  Patient Location: PACU  Anesthesia Type:General  Level of Consciousness: awake  Airway & Oxygen Therapy: Patient Spontanous Breathing and Patient connected to face mask oxygen  Post-op Assessment: Report given to RN and Post -op Vital signs reviewed and stable  Post vital signs: Reviewed  Last Vitals:  Vitals Value Taken Time  BP 156/84 11/01/2017  9:49 AM  Temp    Pulse 67 11/01/2017  9:49 AM  Resp 14 11/01/2017  9:49 AM  SpO2 99 % 11/01/2017  9:49 AM    Last Pain:  Vitals:   11/01/17 0610  TempSrc: Oral         Complications: No apparent anesthesia complications

## 2017-11-01 NOTE — Anesthesia Postprocedure Evaluation (Signed)
Anesthesia Post Note  Patient: Shawn Hardy  Procedure(s) Performed: TRANSURETHRAL RESECTION OF BLADDER TUMOR (TURBT) (N/A )  Patient location during evaluation: PACU Anesthesia Type: General Level of consciousness: awake and alert and oriented Pain management: pain level controlled Vital Signs Assessment: post-procedure vital signs reviewed and stable Respiratory status: spontaneous breathing, nonlabored ventilation and respiratory function stable Cardiovascular status: blood pressure returned to baseline and stable Postop Assessment: no signs of nausea or vomiting Anesthetic complications: no     Last Vitals:  Vitals:   11/01/17 1059 11/01/17 1115  BP: (!) 159/79 (!) 149/63  Pulse:  (!) 57  Resp:  16  Temp:  (!) 36.1 C  SpO2:  100%    Last Pain:  Vitals:   11/01/17 1115  TempSrc:   PainSc: 0-No pain                 Maty Zeisler

## 2017-11-01 NOTE — OR Nursing (Signed)
Discharge instructions discussed with pt, son and x-wife. Copy of foley catheter instructions given to family. Instructed and demostrated to pt and family how to disconnect foley and reattach to use leg bag.

## 2017-11-01 NOTE — H&P (Signed)
UROLOGY H&P UPDATE  Agree with prior H&P dated 10/15/2017  Cardiac: RRR Lungs: CTA bilaterally  Laterality: N/A Procedure: TURBT  Urine: 10/3 Culture negative   Informed consent obtained, we specifically discussed the risks of bleeding, infection, bladder perforation, need for additional procedures, and possible catheter placement.  We also discussed the next steps at length which are pending pathology.  We discussed the next steps in management depend on grade and stage of the tumor.  Possible outcomes include surveillance, repeat TURBT, intravesical therapy, and consideration of cystectomy versus try modal therapy if muscle invasive disease(T2) is found.  Billey Co, MD 11/01/2017

## 2017-11-01 NOTE — Anesthesia Preprocedure Evaluation (Signed)
Anesthesia Evaluation  Patient identified by MRN, date of birth, ID band Patient awake    Reviewed: Allergy & Precautions, NPO status , Patient's Chart, lab work & pertinent test results  History of Anesthesia Complications Negative for: history of anesthetic complications  Airway Mallampati: III  TM Distance: >3 FB Neck ROM: Full    Dental  (+) Poor Dentition   Pulmonary neg pulmonary ROS, neg sleep apnea, neg COPD,    breath sounds clear to auscultation- rhonchi (-) wheezing      Cardiovascular Exercise Tolerance: Good hypertension, Pt. on medications (-) CAD, (-) Past MI, (-) Cardiac Stents and (-) CABG  Rhythm:Regular Rate:Normal - Systolic murmurs and - Diastolic murmurs    Neuro/Psych negative neurological ROS  negative psych ROS   GI/Hepatic negative GI ROS, Neg liver ROS,   Endo/Other  negative endocrine ROSneg diabetes  Renal/GU negative Renal ROS     Musculoskeletal negative musculoskeletal ROS (+)   Abdominal (+) - obese,   Peds  Hematology negative hematology ROS (+)   Anesthesia Other Findings Past Medical History: No date: ED (erectile dysfunction) No date: Glaucoma     Comment:  left eye No date: Hematuria No date: Hyperlipidemia No date: Hypertension No date: Hypokalemia   Reproductive/Obstetrics                             Anesthesia Physical Anesthesia Plan  ASA: II  Anesthesia Plan: General   Post-op Pain Management:    Induction: Intravenous  PONV Risk Score and Plan: 1 and Ondansetron and Midazolam  Airway Management Planned: Oral ETT  Additional Equipment:   Intra-op Plan:   Post-operative Plan: Extubation in OR  Informed Consent: I have reviewed the patients History and Physical, chart, labs and discussed the procedure including the risks, benefits and alternatives for the proposed anesthesia with the patient or authorized representative who has  indicated his/her understanding and acceptance.   Dental advisory given  Plan Discussed with: CRNA and Anesthesiologist  Anesthesia Plan Comments:         Anesthesia Quick Evaluation

## 2017-11-01 NOTE — Discharge Instructions (Signed)

## 2017-11-02 ENCOUNTER — Encounter: Payer: Self-pay | Admitting: Urology

## 2017-11-04 ENCOUNTER — Ambulatory Visit (INDEPENDENT_AMBULATORY_CARE_PROVIDER_SITE_OTHER): Payer: Medicare Other | Admitting: Urology

## 2017-11-04 ENCOUNTER — Encounter: Payer: Self-pay | Admitting: Urology

## 2017-11-04 VITALS — BP 158/90 | HR 103 | Ht 68.0 in | Wt 168.4 lb

## 2017-11-04 DIAGNOSIS — R310 Gross hematuria: Secondary | ICD-10-CM | POA: Diagnosis not present

## 2017-11-04 NOTE — Progress Notes (Signed)
Urology clinic note  Mr. Pile is a 72 year old male status post large TURBT on 11/01/2017.  He called into clinic with difficulty with catheter and bladder spasms. Initial plan was to maintain foley one week secondary to extensive resection.  His 22 [place] two-way Foley is draining pink urine, with bladder scan of 0 cc.  He seems to be having significant bladder spasms with the catheter in place.  I irrigated the catheter with 500 cc of normal saline.  There were no clots.  He did have significant bladder spasms throughout.  We discussed options moving forward including maintaining Foley until the end of the week as previously planned, versus removing Foley and void trial in clinic today.  He is having significant difficulty with catheter, and he would like to have this removed today.  He understands there are risks of hematuria, clot retention.  I encouraged him to drink plenty of fluids to keep the bladder flush, and perform timed voiding every 2 hours during the day to prevent his bladder from over distending.  I instilled 100cc saline into the bladder and removed the catheter. He immediately voided 100cc with PVR of 0cc.  He will follow up at the end of this week to discuss his pathology results.  Nickolas Madrid, MD 11/04/2017

## 2017-11-05 LAB — SURGICAL PATHOLOGY

## 2017-11-07 ENCOUNTER — Encounter: Payer: Self-pay | Admitting: Urology

## 2017-11-07 ENCOUNTER — Ambulatory Visit (INDEPENDENT_AMBULATORY_CARE_PROVIDER_SITE_OTHER): Payer: Medicare Other | Admitting: Urology

## 2017-11-07 VITALS — BP 150/80 | HR 100 | Ht 68.0 in | Wt 168.0 lb

## 2017-11-07 DIAGNOSIS — C678 Malignant neoplasm of overlapping sites of bladder: Secondary | ICD-10-CM

## 2017-11-07 NOTE — Progress Notes (Signed)
   11/07/2017 4:00 PM   Shawn Hardy 12-20-1945 [id number]  Reason for visit: Discuss pathology  HPI: I had the pleasure of seeing Shawn Hardy back in urology clinic to discuss his pathology results.  He is a healthy 72 year old African-American male who presented in late September with gross hematuria.  CT urogram showed multiple large bladder tumors.  He underwent TURBT on 11/01/2017, with final pathology showing low-grade non-invasive Ta bladder cancer.  Total tumor burden was greater than 6 cm.  He passed a void trial post-operatively, and is doing well.  He is voiding clear urine with only mild dysuria.  We had a long discussion today about his new diagnosis of low-grade noninvasive bladder cancer.  We discussed that this typically can be managed endoscopically and with intravesical agents.  Long-term surveillance is necessary.  I did recommend a repeat TURBT in approximately 4 weeks secondary to his high volume disease.  We will plan for post-op instillation of intravesical gemcitabine at that time.  We discussed the 20 to 30% risk of upstaging and his repeat TURBT.  If pathology returns low-grade and noninvasive from second look TURBT, cystoscopy in 3 months in clinic and consider induction course of gemcitabine secondary to his very high volume of tumor.  15 minutes were spent with the patient, greater than 50% were spent in direct face-to-face patient education and counseling regarding new diagnosis of non-invasive bladder cancer and need for long-term surveillance.   Billey Co, [facility] [address], [place], [place] [address] [phone]

## 2017-11-12 ENCOUNTER — Other Ambulatory Visit: Payer: Self-pay | Admitting: Radiology

## 2017-11-12 DIAGNOSIS — C678 Malignant neoplasm of overlapping sites of bladder: Secondary | ICD-10-CM

## 2017-11-13 ENCOUNTER — Other Ambulatory Visit: Payer: Self-pay | Admitting: Radiology

## 2017-12-02 ENCOUNTER — Encounter
Admission: RE | Admit: 2017-12-02 | Discharge: 2017-12-02 | Disposition: A | Discharge status: Home / Self Care | Payer: Medicare Other | Source: Ambulatory Visit | Attending: Urology | Admitting: Urology

## 2017-12-02 ENCOUNTER — Other Ambulatory Visit: Payer: Self-pay

## 2017-12-02 DIAGNOSIS — Z01812 Encounter for preprocedural laboratory examination: Secondary | ICD-10-CM | POA: Insufficient documentation

## 2017-12-02 HISTORY — DX: Malignant neoplasm of bladder, unspecified: C67.9

## 2017-12-02 LAB — URINALYSIS, ROUTINE W REFLEX MICROSCOPIC
BACTERIA UA: NONE SEEN
BILIRUBIN URINE: NEGATIVE
Glucose, UA: NEGATIVE mg/dL
Ketones, ur: NEGATIVE mg/dL
NITRITE: NEGATIVE
PROTEIN: 30 mg/dL — AB
SPECIFIC GRAVITY, URINE: 1.016 (ref 1.005–1.030)
pH: 6 (ref 5.0–8.0)

## 2017-12-02 LAB — POTASSIUM: POTASSIUM: 3.5 mmol/L (ref 3.5–5.1)

## 2017-12-02 NOTE — Patient Instructions (Signed)
Your procedure is scheduled on: Wednesday, December 11, 2017 Report to Day Surgery on the 2nd floor of the [facility]. To find out your arrival time, please call [phone] between 1PM - 3PM on:  Tuesday, December 10, 2017  REMEMBER: Instructions that are not followed completely may result in serious medical risk, up to and including death; or upon the discretion of your surgeon and anesthesiologist your surgery may need to be rescheduled.  Do not eat food after midnight the night before surgery.  No gum chewing, lozengers or hard candies.  You may however, drink CLEAR liquids up to 2 hours before you are scheduled to arrive for your surgery. Do not drink anything within 2 hours of the start of your surgery.  Clear liquids include: - water  - apple juice without pulp - gatorade - black coffee or tea (Do NOT add milk or creamers to the coffee or tea) Do NOT drink anything that is not on this list.  No Alcohol for 24 hours before or after surgery.  No Smoking including e-cigarettes for 24 hours prior to surgery.  No chewable tobacco products for at least 6 hours prior to surgery.  No nicotine patches on the day of surgery.  On the morning of surgery brush your teeth with toothpaste and water, you may rinse your mouth with mouthwash if you wish. Do not swallow any toothpaste or mouthwash.  Notify your doctor if there is any change in your medical condition (cold, fever, infection).  Do not wear jewelry, make-up, hairpins, clips or nail polish.  Do not wear lotions, powders, or perfumes.   Do not shave 48 hours prior to surgery.   Contacts and dentures may not be worn into surgery.  Do not bring valuables to the hospital, including drivers license, insurance or credit cards.  [facility] is not responsible for any belongings or valuables.   TAKE THESE MEDICATIONS THE MORNING OF SURGERY:  1.  Amlodipine 2.  Brimonidine eye drops 3.  Timolol eye drops  On November 13 -  Stop Anti-inflammatories (NSAIDS) such as Advil, Aleve, Ibuprofen, Motrin, Naproxen, Naprosyn and Aspirin based products such as Excedrin, Goodys Powder, BC Powder. (May take Tylenol or Acetaminophen if needed.)  On November 13 - Stop ANY OVER THE COUNTER supplements until after surgery. (May continue multivitamin.)  Wear comfortable clothing (specific to your surgery type) to the hospital.  If you are being discharged the day of surgery, you will not be allowed to drive home. You will need a responsible adult to drive you home and stay with you that night.   If you are taking public transportation, you will need to have a responsible adult with you. Please confirm with your physician that it is acceptable to use public transportation.   Please call [phone] if you have any questions about these instructions.

## 2017-12-02 NOTE — Pre-Procedure Instructions (Signed)
UA FAXED TO SURGEON

## 2017-12-03 LAB — URINE CULTURE: CULTURE: NO GROWTH

## 2017-12-10 MED ORDER — CIPROFLOXACIN IN D5W 400 MG/200ML IV SOLN
400.0000 mg | INTRAVENOUS | Status: AC
  Administered 2017-12-11: 400 mg via INTRAVENOUS

## 2017-12-11 ENCOUNTER — Encounter: Payer: Self-pay | Admitting: *Deleted

## 2017-12-11 ENCOUNTER — Encounter
Admission: RE | Disposition: A | Discharge status: Home / Self Care | Payer: Self-pay | Source: Ambulatory Visit | Attending: Urology

## 2017-12-11 ENCOUNTER — Ambulatory Visit: Payer: Medicare Other | Admitting: Anesthesiology

## 2017-12-11 ENCOUNTER — Other Ambulatory Visit: Payer: Self-pay

## 2017-12-11 ENCOUNTER — Ambulatory Visit
Admission: RE | Admit: 2017-12-11 | Discharge: 2017-12-11 | Disposition: A | Discharge status: Home / Self Care | Payer: Medicare Other | Source: Ambulatory Visit | Attending: Urology | Admitting: Urology

## 2017-12-11 DIAGNOSIS — C678 Malignant neoplasm of overlapping sites of bladder: Secondary | ICD-10-CM

## 2017-12-11 DIAGNOSIS — C679 Malignant neoplasm of bladder, unspecified: Secondary | ICD-10-CM | POA: Insufficient documentation

## 2017-12-11 DIAGNOSIS — I10 Essential (primary) hypertension: Secondary | ICD-10-CM | POA: Diagnosis not present

## 2017-12-11 HISTORY — PX: TRANSURETHRAL RESECTION OF BLADDER TUMOR WITH MITOMYCIN-C: SHX6459

## 2017-12-11 SURGERY — TRANSURETHRAL RESECTION OF BLADDER TUMOR WITH MITOMYCIN-C
Anesthesia: General

## 2017-12-11 MED ORDER — ONDANSETRON HCL 4 MG/2ML IJ SOLN
INTRAMUSCULAR | Status: AC
  Filled 2017-12-11: qty 2

## 2017-12-11 MED ORDER — FAMOTIDINE 20 MG PO TABS
ORAL_TABLET | ORAL | Status: AC
  Filled 2017-12-11: qty 1

## 2017-12-11 MED ORDER — SODIUM CHLORIDE 0.9 % IR SOLN: 2000.0000 mg | Freq: Once | Status: DC

## 2017-12-11 MED ORDER — ROCURONIUM BROMIDE 100 MG/10ML IV SOLN
INTRAVENOUS | Status: DC | PRN
  Administered 2017-12-11: 40 mg via INTRAVENOUS

## 2017-12-11 MED ORDER — GEMCITABINE CHEMO FOR BLADDER INSTILLATION 2000 MG
INTRAVENOUS | Status: DC | PRN
  Administered 2017-12-11: 2000 mg via INTRAVESICAL

## 2017-12-11 MED ORDER — HYDROMORPHONE HCL 1 MG/ML IJ SOLN
INTRAMUSCULAR | Status: AC
  Filled 2017-12-11: qty 1

## 2017-12-11 MED ORDER — FENTANYL CITRATE (PF) 100 MCG/2ML IJ SOLN
INTRAMUSCULAR | Status: DC | PRN
  Administered 2017-12-11 (×2): 50 ug via INTRAVENOUS

## 2017-12-11 MED ORDER — FENTANYL CITRATE (PF) 100 MCG/2ML IJ SOLN
25.0000 ug | INTRAMUSCULAR | Status: DC | PRN
  Administered 2017-12-11 (×2): 25 ug via INTRAVENOUS

## 2017-12-11 MED ORDER — LACTATED RINGERS IV SOLN
INTRAVENOUS | Status: DC
  Administered 2017-12-11: 10:00:00 via INTRAVENOUS

## 2017-12-11 MED ORDER — FENTANYL CITRATE (PF) 100 MCG/2ML IJ SOLN
INTRAMUSCULAR | Status: AC
  Filled 2017-12-11: qty 2

## 2017-12-11 MED ORDER — PROPOFOL 10 MG/ML IV BOLUS
INTRAVENOUS | Status: DC | PRN
  Administered 2017-12-11: 140 mg via INTRAVENOUS

## 2017-12-11 MED ORDER — SUGAMMADEX SODIUM 200 MG/2ML IV SOLN
INTRAVENOUS | Status: DC | PRN
  Administered 2017-12-11: 200 mg via INTRAVENOUS

## 2017-12-11 MED ORDER — DEXAMETHASONE SODIUM PHOSPHATE 10 MG/ML IJ SOLN
INTRAMUSCULAR | Status: DC | PRN
  Administered 2017-12-11: 8 mg via INTRAVENOUS

## 2017-12-11 MED ORDER — HYDROCODONE-ACETAMINOPHEN 5-325 MG PO TABS: 1.0000 | ORAL_TABLET | ORAL | 0 refills | Status: AC | PRN

## 2017-12-11 MED ORDER — PROPOFOL 10 MG/ML IV BOLUS
INTRAVENOUS | Status: AC
  Filled 2017-12-11: qty 20

## 2017-12-11 MED ORDER — ONDANSETRON HCL 4 MG/2ML IJ SOLN: 4.0000 mg | Freq: Once | INTRAMUSCULAR | Status: DC | PRN

## 2017-12-11 MED ORDER — DEXAMETHASONE SODIUM PHOSPHATE 10 MG/ML IJ SOLN
INTRAMUSCULAR | Status: AC
  Filled 2017-12-11: qty 1

## 2017-12-11 MED ORDER — CIPROFLOXACIN IN D5W 400 MG/200ML IV SOLN
INTRAVENOUS | Status: AC
  Filled 2017-12-11: qty 200

## 2017-12-11 MED ORDER — LIDOCAINE HCL (CARDIAC) PF 100 MG/5ML IV SOSY
PREFILLED_SYRINGE | INTRAVENOUS | Status: DC | PRN
  Administered 2017-12-11: 80 mg via INTRAVENOUS

## 2017-12-11 MED ORDER — FAMOTIDINE 20 MG PO TABS
20.0000 mg | ORAL_TABLET | Freq: Once | ORAL | Status: AC
  Administered 2017-12-11: 20 mg via ORAL

## 2017-12-11 SURGICAL SUPPLY — 31 items
BAG DRAIN CYSTO-URO LG1000N (MISCELLANEOUS) ×2 IMPLANT
BAG URINE DRAINAGE (UROLOGICAL SUPPLIES) ×2 IMPLANT
BRUSH SCRUB EZ  4% CHG (MISCELLANEOUS) ×1
BRUSH SCRUB EZ 4% CHG (MISCELLANEOUS) ×1 IMPLANT
CATH COUDE FOLEY 2W 5CC 18FR (CATHETERS) ×2 IMPLANT
CATH FOLEY 2WAY  5CC 16FR (CATHETERS) ×1
CATH URTH 16FR FL 2W BLN LF (CATHETERS) ×1 IMPLANT
CRADLE LAMINECT ARM (MISCELLANEOUS) ×2 IMPLANT
DRAPE UTILITY 15X26 TOWEL STRL (DRAPES) ×2 IMPLANT
DRSG TELFA 4X3 1S NADH ST (GAUZE/BANDAGES/DRESSINGS) ×2 IMPLANT
ELECT LOOP 22F BIPOLAR SML (ELECTROSURGICAL)
ELECT REM PT RETURN 9FT ADLT (ELECTROSURGICAL)
ELECTRODE LOOP 22F BIPOLAR SML (ELECTROSURGICAL) IMPLANT
ELECTRODE REM PT RTRN 9FT ADLT (ELECTROSURGICAL) IMPLANT
GLOVE BIOGEL PI IND STRL 7.5 (GLOVE) ×1 IMPLANT
GLOVE BIOGEL PI INDICATOR 7.5 (GLOVE) ×1
GOWN STRL REUS W/ TWL LRG LVL3 (GOWN DISPOSABLE) ×1 IMPLANT
GOWN STRL REUS W/ TWL XL LVL3 (GOWN DISPOSABLE) ×1 IMPLANT
GOWN STRL REUS W/TWL LRG LVL3 (GOWN DISPOSABLE) ×1
GOWN STRL REUS W/TWL XL LVL3 (GOWN DISPOSABLE) ×1
KIT TURNOVER CYSTO (KITS) ×2 IMPLANT
LOOP CUT BIPOLAR 24F LRG (ELECTROSURGICAL) ×4 IMPLANT
NDL SAFETY ECLIPSE 18X1.5 (NEEDLE) ×1 IMPLANT
NEEDLE HYPO 18GX1.5 SHARP (NEEDLE) ×1
PACK CYSTO AR (MISCELLANEOUS) ×2 IMPLANT
SENSORWIRE 0.038 NOT ANGLED (WIRE) ×2
SET IRRIG Y TYPE TUR BLADDER L (SET/KITS/TRAYS/PACK) ×2 IMPLANT
SURGILUBE 2OZ TUBE FLIPTOP (MISCELLANEOUS) ×2 IMPLANT
SYRINGE IRR TOOMEY STRL 70CC (SYRINGE) ×2 IMPLANT
WATER STERILE IRR 1000ML POUR (IV SOLUTION) ×2 IMPLANT
WIRE SENSOR 0.038 NOT ANGLED (WIRE) ×1 IMPLANT

## 2017-12-11 NOTE — Anesthesia Post-op Follow-up Note (Signed)
Anesthesia QCDR form completed.        

## 2017-12-11 NOTE — Anesthesia Preprocedure Evaluation (Signed)
Anesthesia Evaluation  Patient identified by MRN, date of birth, ID band Patient awake    Reviewed: Allergy & Precautions, NPO status , Patient's Chart, lab work & pertinent test results, reviewed documented beta blocker date and time   Airway Mallampati: II  TM Distance: >3 FB     Dental  (+) Chipped   Pulmonary           Cardiovascular hypertension, Pt. on medications      Neuro/Psych    GI/Hepatic   Endo/Other    Renal/GU      Musculoskeletal   Abdominal   Peds  Hematology   Anesthesia Other Findings   Reproductive/Obstetrics                             Anesthesia Physical Anesthesia Plan  ASA: II  Anesthesia Plan: General   Post-op Pain Management:    Induction: Intravenous  PONV Risk Score and Plan:   Airway Management Planned: Oral ETT  Additional Equipment:   Intra-op Plan:   Post-operative Plan:   Informed Consent: I have reviewed the patients History and Physical, chart, labs and discussed the procedure including the risks, benefits and alternatives for the proposed anesthesia with the patient or authorized representative who has indicated his/her understanding and acceptance.     Plan Discussed with: CRNA  Anesthesia Plan Comments:         Anesthesia Quick Evaluation

## 2017-12-11 NOTE — Anesthesia Procedure Notes (Signed)
Procedure Name: Intubation Date/Time: 12/11/2017 12:30 PM Performed by: Johnna Acosta, CRNA Pre-anesthesia Checklist: Patient identified, Emergency Drugs available, Suction available, Patient being monitored and Timeout performed Patient Re-evaluated:Patient Re-evaluated prior to induction Oxygen Delivery Method: Circle system utilized Preoxygenation: Pre-oxygenation with 100% oxygen Induction Type: IV induction Ventilation: Mask ventilation without difficulty Laryngoscope Size: Miller and 2 Grade View: Grade I Tube type: Oral Tube size: 7.5 mm Number of attempts: 1 Airway Equipment and Method: Stylet Placement Confirmation: ETT inserted through vocal cords under direct vision,  positive ETCO2 and breath sounds checked- equal and bilateral Secured at: 22 cm Tube secured with: Tape Dental Injury: Teeth and Oropharynx as per pre-operative assessment

## 2017-12-11 NOTE — Transfer of Care (Signed)
Immediate Anesthesia Transfer of Care Note  Patient: Shawn Hardy  Procedure(s) Performed: TRANSURETHRAL RESECTION OF BLADDER TUMOR WITH gemcitabine (N/A )  Patient Location: PACU  Anesthesia Type:General  Level of Consciousness: awake, alert  and oriented  Airway & Oxygen Therapy: Patient Spontanous Breathing and Patient connected to face mask oxygen  Post-op Assessment: Report given to RN and Post -op Vital signs reviewed and stable  Post vital signs: Reviewed and stable  Last Vitals:  Vitals Value Taken Time  BP    Temp    Pulse 85 12/11/2017  2:01 PM  Resp 15 12/11/2017  2:01 PM  SpO2 100 % 12/11/2017  2:01 PM  Vitals shown include unvalidated device data.  Last Pain:  Vitals:   12/11/17 0937  TempSrc: Oral  PainSc: 0-No pain         Complications: No apparent anesthesia complications

## 2017-12-11 NOTE — Anesthesia Postprocedure Evaluation (Signed)
Anesthesia Post Note  Patient: Shawn Hardy  Procedure(s) Performed: TRANSURETHRAL RESECTION OF BLADDER TUMOR WITH gemcitabine (N/A )  Patient location during evaluation: PACU Anesthesia Type: General Level of consciousness: awake and alert Pain management: pain level controlled Vital Signs Assessment: post-procedure vital signs reviewed and stable Respiratory status: spontaneous breathing, nonlabored ventilation, respiratory function stable and patient connected to nasal cannula oxygen Cardiovascular status: blood pressure returned to baseline and stable Postop Assessment: no apparent nausea or vomiting Anesthetic complications: no     Last Vitals:  Vitals:   12/11/17 1518 12/11/17 1553  BP: (!) 149/69 (!) 152/76  Pulse: 69 82  Resp: 16 16  Temp: 36.6 C   SpO2: 100% 100%    Last Pain:  Vitals:   12/11/17 1518  TempSrc: Temporal  PainSc: 0-No pain                 Tanay Misuraca S

## 2017-12-11 NOTE — Progress Notes (Signed)
Foley Catheter unclamped per MD order.

## 2017-12-11 NOTE — Discharge Instructions (Signed)

## 2017-12-11 NOTE — Op Note (Signed)
Date of procedure: 12/11/17  Preoperative diagnosis:  1. Non-invasive bladder cancer  Postoperative diagnosis:  1. Same  Procedure: 1. TURBT, large, greater than 5 cm  Surgeon: Nickolas Madrid, MD  Anesthesia: General  Complications: None  Intraoperative findings:  1.  3 new small additional papillary appearing tumors at the posterior wall 2.  Patch of papillary appearing tumor at the anterior wall overhanging the bladder neck 3.  Prior sites were re-resected  EBL: Minimal  Specimens: Bladder tumor for permanent  Drains: 18 French two-way coud Foley  Indication: Shawn Hardy is a 72 y.o. patient with high volume low-grade Ta bladder cancer status post initial resection 11/01/2017.  After reviewing the management options for treatment, they elected to proceed with the above surgical procedure(s). We have discussed the potential benefits and risks of the procedure, side effects of the proposed treatment, the likelihood of the patient achieving the goals of the procedure, and any potential problems that might occur during the procedure or recuperation. Informed consent has been obtained.  Description of procedure:  The patient was taken to the operating room and general anesthesia was induced.  The patient was placed in the dorsal lithotomy position, prepped and draped in the usual sterile fashion, and preoperative antibiotics(Cipro) were administered. A preoperative time-out was performed.   A 21 French rigid cystoscope with a 30 degree lens was used to intubate the urethra.  The urethra was grossly normal.  The prostate was moderate in size.  Thorough inspection of the bladder was performed with a 30 degree and 70 degree lens.  There were 3 new additional approximately 1 cm tumors at the posterior wall.  There was also some papillary changes around the prior areas of resection on each of the lateral walls, as well as tumor at the anterior wall overhanging the bladder neck from 10  o'clock to 2 o'clock position.  The right ureteral orifice was clearly identified, however the left was significantly more challenging secondary to the papillary changes and scar tissue at the left trigone.  Using the large resecting loop and the bipolar resectoscope, all these areas of tumors were resected.  Care was taken to avoid fulguration near the areas of the ureteral orifices.  At the conclusion, there was no residual visible tumor.  The tumor chips were evacuated and sent for pathology.  The bipolar button was then used to fulgurate the base of the tumors throughout the bladder, again taking care to use judicious cautery near the ureteral orifices.  In a decompressed state, there was no bleeding noted.  A 18 French coud catheter passed easily into the bladder with return of clear fluid.  This was gently irrigated and was faint pink.   Disposition: Stable to PACU  2000 mg / 4mL gemcitabine were instilled into the bladder, and a Claiborne Billings was used to clamp the Foley distal to the balloon.  Plan: Remove Foley prior to discharge Follow-up pathology Anticipate follow-up for clinic cystoscopy in 3 months, will consider induction intravesical gemcitabine  Nickolas Madrid, MD

## 2017-12-11 NOTE — H&P (Signed)
   11:01 AM   Adonus Uselman 1945/04/03 [id number]   HPI: Mr. Caraway is here today for second look TURBT.  He is a 72 year old African-American male who is relatively healthy who underwent a large TURBT 09/17/2016 with final pathology showing low-grade Ta.  Secondary to the extensive tumor volume, he is here for second look TURBT.  He denies any fevers or chills.  Denies any gross hematuria.  He did not tolerate the Foley catheter well after his last procedure with significant bladder spasms.   PMH: Past Medical History:  Diagnosis Date  . Bladder cancer ([place])   . ED (erectile dysfunction)   . Glaucoma    left eye  . Hematuria   . Hyperlipidemia   . Hypertension   . Hypokalemia     Surgical History: Past Surgical History:  Procedure Laterality Date  . COLONOSCOPY WITH PROPOFOL N/A 09/17/2016   Procedure: COLONOSCOPY WITH PROPOFOL;  Surgeon: Lollie Sails, MD;  Location: [facility] ENDOSCOPY;  Service: Endoscopy;  Laterality: N/A;  . EYE SURGERY Left    cataract extraction  . KNEE SURGERY Right 1980   arthroscopy  . TRANSURETHRAL RESECTION OF BLADDER TUMOR N/A 11/01/2017   Procedure: TRANSURETHRAL RESECTION OF BLADDER TUMOR (TURBT);  Surgeon: Billey Co, MD;  Location: [facility] ORS;  Service: Urology;  Laterality: N/A;    Allergies: No Known Allergies  Family History: Family History  Problem Relation Age of Onset  . Cirrhosis Father   . Heart attack Mother     Social History:  reports that he has never smoked. He has never used smokeless tobacco. He reports that he drinks alcohol. He reports that he does not use drugs.  ROS: Please see flowsheet from today's date for complete review of systems.  Physical Exam: BP (!) 154/88   Pulse 75   Temp 97.9 F (36.6 C) (Oral)   Resp 14   Ht 5\' 8"  (1.727 m)   Wt 75.8 kg   SpO2 99%   BMI 25.39 kg/m    Constitutional:  Alert and oriented, No acute distress. Cardiovascular: Regular rate and rhythm Respiratory: Clear to  auscultation bilaterally GI: Abdomen is soft, nontender, nondistended, no abdominal masses GU: No CVA tenderness Lymph: No cervical or inguinal lymphadenopathy. Skin: No rashes, bruises or suspicious lesions. Neurologic: Grossly intact, no focal deficits, moving all 4 extremities. Psychiatric: Normal mood and affect.  Laboratory Data: Urine culture 12/02/2017 no growth   Assessment & Plan:   In summary, Mr. Bretado is a 72 year old healthy male with history of low-grade Ta bladder cancer with large volume, originally resected 09/17/2017.  Proceed with second look TURBT with gemcitabine instillation.  We discussed the risks of bleeding, infection, potential change in pathology, possible need for Foley placement, bladder perforation, and need for additional procedures as well as close surveillance with clinic cystoscopy in the future.  Billey Co, [facility] [address], [place], [place] [address] [phone]

## 2017-12-12 ENCOUNTER — Encounter: Payer: Self-pay | Admitting: Urology

## 2017-12-13 ENCOUNTER — Ambulatory Visit (INDEPENDENT_AMBULATORY_CARE_PROVIDER_SITE_OTHER): Payer: Medicare Other

## 2017-12-13 DIAGNOSIS — Z4889 Encounter for other specified surgical aftercare: Secondary | ICD-10-CM | POA: Diagnosis not present

## 2017-12-13 LAB — SURGICAL PATHOLOGY

## 2017-12-13 MED ORDER — OXYBUTYNIN CHLORIDE ER 10 MG PO TB24: 10.0000 mg | ORAL_TABLET | Freq: Every day | ORAL | 0 refills | Status: AC

## 2017-12-13 NOTE — Progress Notes (Signed)
Patient presents in office today per the advice of Dr.Sninsky. Patient states that he has now been close to 24hrs with urinating. Placed a 20 Coude FR successfully, drained approximately 627mL from the bladder at which time patient passed one clot, attempted to irrigate cath, irrigation was initially unsuccessful. Provider was called into room for evaluation. Dr.Stoioff proceeded to irrigate cath with no complications. Patient to RTC in 3 days.

## 2017-12-16 ENCOUNTER — Ambulatory Visit (INDEPENDENT_AMBULATORY_CARE_PROVIDER_SITE_OTHER): Payer: Medicare Other

## 2017-12-16 DIAGNOSIS — N401 Enlarged prostate with lower urinary tract symptoms: Secondary | ICD-10-CM

## 2017-12-16 DIAGNOSIS — N138 Other obstructive and reflux uropathy: Secondary | ICD-10-CM

## 2017-12-16 LAB — BLADDER SCAN AMB NON-IMAGING

## 2017-12-16 NOTE — Progress Notes (Signed)
Per Dr. Diamantina Providence patient present today for a Voiding Trial  Fill and Pull Catheter Removal  Patient is present today for a catheter removal.  Patient was cleaned and prepped in a sterile fashion 127ml of sterile water/ saline was instilled into the bladder when the patient felt the urge to urinate. 29ml of water was then drained from the balloon.  A 20FR foley cath was removed from the bladder no complications were noted .  Patient as then given some time to void on their own.  Patient can void  156ml on their own after some time.  Patient tolerated well.  Preformed by: Fonnie Jarvis, CMA  Follow up/ Additional notes: Patient had a bladder spasm during instillation and only 178ml was instilled. Patient was able to void, due to history of clot retention patient was instructed to return this afternoon at 2:30pm for a PVR

## 2017-12-31 ENCOUNTER — Ambulatory Visit: Payer: Medicare Other | Admitting: Urology

## 2017-12-31 ENCOUNTER — Encounter: Payer: Self-pay | Admitting: Urology

## 2017-12-31 VITALS — BP 138/78 | HR 78 | Ht 68.0 in | Wt 166.3 lb

## 2017-12-31 DIAGNOSIS — C679 Malignant neoplasm of bladder, unspecified: Secondary | ICD-10-CM | POA: Diagnosis not present

## 2017-12-31 DIAGNOSIS — R300 Dysuria: Secondary | ICD-10-CM | POA: Diagnosis not present

## 2017-12-31 NOTE — Progress Notes (Signed)
   12/31/2017 4:31 PM   Shawn Hardy 08-Aug-1945 [id number]  Reason for visit: Follow up bladder cancer  I saw Shawn Hardy back in urology clinic today to discuss his pathology from second look TURBT.  Briefly he is a healthy 72 year old man who was diagnosed with multiple large bladder tumors in October 2019, and originally underwent a large TURBT on 11/01/2017 with pathology showing low-grade Ta urothelial cell cancer.  Secondary to extensive disease and some residual tumor burden, he underwent second look TURBT on 12/11/2017, which again showed low-grade Ta urothelial cell cancer.  He is a non-smoker  Since his surgeries he has been doing well and has just mild urgency and frequency to urinate, with occasional dysuria.  He denies any ongoing gross hematuria.  We had an extensive discussion about non-muscle invasive bladder cancer today.  We discussed the high recurrence rate of this tumor, however the less than 5% progression rate to metastatic disease.  I also counseled him extensively that with his high tumor burden he is at high risk for recurrence.  I recommended undergoing induction intravesical chemotherapy with gemcitabine, with consideration of maintenance in the future.  He will be due for his first surveillance cystoscopy in February 2020.  Referral placed to medical oncology for 6-week course of weekly intravesical gemcitabine Clinic cystoscopy February 2020 for surveillance  A total of 15 minutes were spent face-to-face with the patient, greater than 50% was spent in patient education, counseling, and coordination of care regarding non-muscle invasive bladder cancer, intravesical chemotherapy, and need for close surveillance with cystoscopy.   Billey Co, [facility] [address] [place], [place] [address] [phone]

## 2018-01-01 LAB — URINALYSIS, COMPLETE
Bilirubin, UA: NEGATIVE
Glucose, UA: NEGATIVE
Ketones, UA: NEGATIVE
Nitrite, UA: NEGATIVE
PH UA: 6 (ref 5.0–7.5)
Specific Gravity, UA: >1.03 — ABNORMAL HIGH (ref 1.005–1.030)
Urobilinogen, Ur: 0.2 mg/dL (ref 0.2–1.0)

## 2018-01-01 LAB — MICROSCOPIC EXAMINATION

## 2018-01-24 DIAGNOSIS — C689 Malignant neoplasm of urinary organ, unspecified: Secondary | ICD-10-CM | POA: Insufficient documentation

## 2018-01-24 NOTE — Progress Notes (Signed)
[name]  [phone] [phone]  ID: Shawn Hardy OB: 1945/01/27  MR#: [id number]  DZH#:[id number]  Patient Care Team: [place] Pink, MD as PCP - General (Family Medicine)  CHIEF COMPLAINT: Noninvasive urothelial carcinoma.  INTERVAL HISTORY: Patient is a 73 year old male with a history of noninvasive bladder cancer and referred for intravesical gemcitabine.  He currently feels well and is asymptomatic.  He has no neurologic complaints.  He denies any recent fevers or illnesses.  He has a good appetite and denies weight loss.  He has no chest pain or shortness of breath.  He denies any nausea, vomiting, constipation, or diarrhea.  He has no urinary complaints.  Patient feels at his baseline offers no specific complaints today.  REVIEW OF SYSTEMS:   Review of Systems  Constitutional: Negative.  Negative for fever, malaise/fatigue and weight loss.  Respiratory: Negative.  Negative for cough, hemoptysis and shortness of breath.   Cardiovascular: Negative.  Negative for chest pain and leg swelling.  Gastrointestinal: Negative.  Negative for abdominal pain.  Genitourinary: Negative.  Negative for dysuria, frequency and hematuria.  Musculoskeletal: Negative.  Negative for back pain.  Skin: Negative.  Negative for rash.  Neurological: Negative.  Negative for focal weakness, weakness and headaches.  Psychiatric/Behavioral: Negative.  The patient is not nervous/anxious.     As per HPI. Otherwise, a complete review of systems is negative.  PAST MEDICAL HISTORY: Past Medical History:  Diagnosis Date  . Bladder cancer ([place])   . ED (erectile dysfunction)   . Glaucoma    left eye  . Hematuria   . Hyperlipidemia   . Hypertension   . Hypokalemia     PAST SURGICAL HISTORY: Past Surgical History:  Procedure Laterality Date  . COLONOSCOPY WITH PROPOFOL N/A 09/17/2016   Procedure: COLONOSCOPY WITH PROPOFOL;  Surgeon: Lollie Sails, MD;   Location: [facility] ENDOSCOPY;  Service: Endoscopy;  Laterality: N/A;  . EYE SURGERY Left    cataract extraction  . KNEE SURGERY Right 1980   arthroscopy  . TRANSURETHRAL RESECTION OF BLADDER TUMOR N/A 11/01/2017   Procedure: TRANSURETHRAL RESECTION OF BLADDER TUMOR (TURBT);  Surgeon: Billey Co, MD;  Location: [facility] ORS;  Service: Urology;  Laterality: N/A;  . TRANSURETHRAL RESECTION OF BLADDER TUMOR WITH MITOMYCIN-C N/A 12/11/2017   Procedure: TRANSURETHRAL RESECTION OF BLADDER TUMOR WITH gemcitabine;  Surgeon: Billey Co, MD;  Location: [facility] ORS;  Service: Urology;  Laterality: N/A;    FAMILY HISTORY: Family History  Problem Relation Age of Onset  . Cirrhosis Father   . Heart attack Mother     ADVANCED DIRECTIVES (Y/N):  N  HEALTH MAINTENANCE: Social History   Tobacco Use  . Smoking status: Never Smoker  . Smokeless tobacco: Never Used  Substance Use Topics  . Alcohol use: Yes    Comment: "very little"  . Drug use: No     Colonoscopy:  PAP:  Bone density:  Lipid panel:  No Known Allergies  Current Outpatient Medications  Medication Sig Dispense Refill  . amLODipine (NORVASC) 10 MG tablet Take 10 mg by mouth daily after lunch.     . benazepril (LOTENSIN) 20 MG tablet Take 20 mg by mouth daily after lunch.     . brimonidine (ALPHAGAN) 0.2 % ophthalmic solution Place 1 drop into both eyes 3 (three) times daily.    . Latanoprost (XELPROS) 0.005 % EMUL Place 1 drop into the right eye at bedtime.    . Multiple Vitamins-Minerals (CENTRUM SILVER PO) Take 1  tablet by mouth daily.    . potassium chloride (K-DUR,KLOR-CON) 10 MEQ tablet Take 20 mEq by mouth 2 (two) times daily.    . timolol (TIMOPTIC) 0.5 % ophthalmic solution Place 1 drop into both eyes 3 (three) times daily.   6  . triamterene-hydrochlorothiazide (DYAZIDE) 37.5-25 MG capsule Take 1 capsule by mouth daily after lunch.      No current facility-administered medications for this visit.      OBJECTIVE: Vitals:   01/28/18 1456  BP: (!) 166/88  Pulse: 84  Temp: 97.6 F (36.4 C)     Body mass index is 25.39 kg/m.    ECOG FS:0 - Asymptomatic  General: Well-developed, well-nourished, no acute distress. Eyes: Pink conjunctiva, anicteric sclera. HEENT: Normocephalic, moist mucous membranes, clear oropharnyx. Lungs: Clear to auscultation bilaterally. Heart: Regular rate and rhythm. No rubs, murmurs, or gallops. Abdomen: Soft, nontender, nondistended. No organomegaly noted, normoactive bowel sounds. Musculoskeletal: No edema, cyanosis, or clubbing. Neuro: Alert, answering all questions appropriately. Cranial nerves grossly intact. Skin: No rashes or petechiae noted. Psych: Normal affect. Lymphatics: No cervical, calvicular, axillary or inguinal LAD.   LAB RESULTS:  Lab Results  Component Value Date   K 3.5 12/02/2017   BUN 18 09/10/2017   CREATININE 1.54 (H) 09/10/2017   GFRNONAA 44 (L) 09/10/2017   GFRAA 51 (L) 09/10/2017    No results found for: WBC, NEUTROABS, HGB, HCT, MCV, PLT   STUDIES: No results found.  ASSESSMENT: Noninvasive urothelial carcinoma.   PLAN:    1.  Noninvasive urothelial carcinoma: Patient referred for 6 weekly cycles of intravascular gemcitabine.  Patient reports he has received this once before several months ago.  Return to clinic on February 05, 2018 received cycle 1 of 6 of treatment.  A UA will be tested prior to each treatment to ensure there is no urinary tract infection.  Patient expressed understanding that any questions for laboratory work regarding his treatment or side effects should be referred to his primary urologist.  No further interventions are needed.  No follow-up has been scheduled at the conclusion of his treatments.  I spent a total of 45 minutes face-to-face with the patient of which greater than 50% of the visit was spent in counseling and coordination of care as detailed above.   Patient expressed  understanding and was in agreement with this plan. He also understands that He can call clinic at any time with any questions, concerns, or complaints.    Lloyd Huger, MD   01/31/2018 11:10 AM

## 2018-01-28 ENCOUNTER — Other Ambulatory Visit: Payer: Self-pay

## 2018-01-28 ENCOUNTER — Inpatient Hospital Stay: Payer: Medicare Other | Attending: Oncology | Admitting: Oncology

## 2018-01-28 DIAGNOSIS — I10 Essential (primary) hypertension: Secondary | ICD-10-CM | POA: Diagnosis not present

## 2018-01-28 DIAGNOSIS — Z5111 Encounter for antineoplastic chemotherapy: Secondary | ICD-10-CM | POA: Diagnosis present

## 2018-01-28 DIAGNOSIS — C679 Malignant neoplasm of bladder, unspecified: Secondary | ICD-10-CM | POA: Diagnosis present

## 2018-01-28 DIAGNOSIS — C689 Malignant neoplasm of urinary organ, unspecified: Secondary | ICD-10-CM

## 2018-01-28 NOTE — Progress Notes (Signed)
Patient is here to establish care for malignant neoplasm of urinary bladder. Patient stated that he did not have any complaints today.

## 2018-01-31 NOTE — Progress Notes (Signed)
Bladder - No Medical Intervention - Off Treatment.  Patient Characteristics: Ta, Tis, T1, N0 AJCC M Category: M0 AJCC N Category: N0 AJCC T Category: Tis Current evidence of distant metastases<= No AJCC 8 Stage Grouping: 0is

## 2018-02-05 ENCOUNTER — Inpatient Hospital Stay: Payer: Medicare Other

## 2018-02-05 VITALS — BP 133/85 | HR 79 | Temp 97.3°F | Wt 169.2 lb

## 2018-02-05 DIAGNOSIS — Z5111 Encounter for antineoplastic chemotherapy: Secondary | ICD-10-CM | POA: Diagnosis not present

## 2018-02-05 DIAGNOSIS — C689 Malignant neoplasm of urinary organ, unspecified: Secondary | ICD-10-CM

## 2018-02-05 LAB — URINALYSIS, COMPLETE (UACMP) WITH MICROSCOPIC
BACTERIA UA: NONE SEEN
Bilirubin Urine: NEGATIVE
Glucose, UA: NEGATIVE mg/dL
Hgb urine dipstick: NEGATIVE
Ketones, ur: NEGATIVE mg/dL
Leukocytes, UA: NEGATIVE
Nitrite: NEGATIVE
Protein, ur: 30 mg/dL — AB
SPECIFIC GRAVITY, URINE: 1.013 (ref 1.005–1.030)
Squamous Epithelial / HPF: NONE SEEN (ref 0–5)
WBC, UA: NONE SEEN WBC/hpf (ref 0–5)
pH: 7 (ref 5.0–8.0)

## 2018-02-05 MED ORDER — GEMCITABINE CHEMO FOR BLADDER INSTILLATION 2000 MG
2000.0000 mg | Freq: Once | INTRAVENOUS | Status: AC
  Administered 2018-02-05: 2000 mg via INTRAVESICAL
  Filled 2018-02-05: qty 52.6

## 2018-02-05 MED ORDER — PROCHLORPERAZINE MALEATE 10 MG PO TABS
10.0000 mg | ORAL_TABLET | Freq: Once | ORAL | Status: AC
  Administered 2018-02-05: 10 mg via ORAL
  Filled 2018-02-05: qty 1

## 2018-02-12 ENCOUNTER — Inpatient Hospital Stay: Payer: Medicare Other

## 2018-02-12 VITALS — BP 151/87 | HR 74 | Temp 97.8°F | Resp 18

## 2018-02-12 DIAGNOSIS — Z5111 Encounter for antineoplastic chemotherapy: Secondary | ICD-10-CM | POA: Diagnosis not present

## 2018-02-12 DIAGNOSIS — C689 Malignant neoplasm of urinary organ, unspecified: Secondary | ICD-10-CM

## 2018-02-12 LAB — URINALYSIS, COMPLETE (UACMP) WITH MICROSCOPIC
Bacteria, UA: NONE SEEN
Bilirubin Urine: NEGATIVE
Glucose, UA: NEGATIVE mg/dL
Hgb urine dipstick: NEGATIVE
Ketones, ur: NEGATIVE mg/dL
Leukocytes, UA: NEGATIVE
Nitrite: NEGATIVE
Protein, ur: 100 mg/dL — AB
Specific Gravity, Urine: 1.024 (ref 1.005–1.030)
pH: 6 (ref 5.0–8.0)

## 2018-02-12 MED ORDER — SODIUM CHLORIDE 0.9 % IV SOLN
Freq: Once | INTRAVENOUS | Status: DC
  Filled 2018-02-12: qty 250

## 2018-02-12 MED ORDER — GEMCITABINE CHEMO FOR BLADDER INSTILLATION 2000 MG
2000.0000 mg | Freq: Once | INTRAVENOUS | Status: AC
  Administered 2018-02-12: 2000 mg via INTRAVESICAL
  Filled 2018-02-12: qty 52.6

## 2018-02-12 MED ORDER — PROCHLORPERAZINE MALEATE 10 MG PO TABS
10.0000 mg | ORAL_TABLET | Freq: Once | ORAL | Status: AC
  Administered 2018-02-12: 10 mg via ORAL
  Filled 2018-02-12: qty 1

## 2018-02-12 MED ORDER — LIDOCAINE HCL URETHRAL/MUCOSAL 2 % EX GEL: 1.0000 "application " | Freq: Once | CUTANEOUS | Status: DC

## 2018-02-12 NOTE — Progress Notes (Signed)
11:15 - 22 Fr indwelling catheter inserted for administration of gemcitabine.  11:45 - indwelling catheter removed without incidence.  Output 200 cc

## 2018-02-12 NOTE — Progress Notes (Signed)
Correction - 16 Fr catheter inserted for administration of gemcitabine

## 2018-02-19 ENCOUNTER — Inpatient Hospital Stay: Payer: Medicare Other

## 2018-02-19 VITALS — BP 146/79 | HR 71 | Temp 96.8°F | Resp 18 | Wt 171.0 lb

## 2018-02-19 DIAGNOSIS — Z5111 Encounter for antineoplastic chemotherapy: Secondary | ICD-10-CM | POA: Diagnosis not present

## 2018-02-19 DIAGNOSIS — C689 Malignant neoplasm of urinary organ, unspecified: Secondary | ICD-10-CM

## 2018-02-19 LAB — URINALYSIS, COMPLETE (UACMP) WITH MICROSCOPIC
Bacteria, UA: NONE SEEN
Bilirubin Urine: NEGATIVE
Glucose, UA: NEGATIVE mg/dL
Hgb urine dipstick: NEGATIVE
Ketones, ur: NEGATIVE mg/dL
LEUKOCYTES UA: NEGATIVE
Nitrite: NEGATIVE
Protein, ur: NEGATIVE mg/dL
Specific Gravity, Urine: 1.011 (ref 1.005–1.030)
pH: 7 (ref 5.0–8.0)

## 2018-02-19 MED ORDER — GEMCITABINE CHEMO FOR BLADDER INSTILLATION 2000 MG
2000.0000 mg | Freq: Once | INTRAVENOUS | Status: AC
  Administered 2018-02-19: 2000 mg via INTRAVESICAL
  Filled 2018-02-19: qty 52.6

## 2018-02-19 MED ORDER — PROCHLORPERAZINE MALEATE 10 MG PO TABS
10.0000 mg | ORAL_TABLET | Freq: Once | ORAL | Status: AC
  Administered 2018-02-19: 10 mg via ORAL
  Filled 2018-02-19: qty 1

## 2018-02-26 ENCOUNTER — Inpatient Hospital Stay: Payer: Medicare Other | Attending: Oncology

## 2018-02-26 VITALS — BP 137/77 | HR 76 | Temp 96.8°F | Resp 18 | Wt 171.2 lb

## 2018-02-26 DIAGNOSIS — C679 Malignant neoplasm of bladder, unspecified: Secondary | ICD-10-CM | POA: Insufficient documentation

## 2018-02-26 DIAGNOSIS — Z5111 Encounter for antineoplastic chemotherapy: Secondary | ICD-10-CM | POA: Insufficient documentation

## 2018-02-26 DIAGNOSIS — C689 Malignant neoplasm of urinary organ, unspecified: Secondary | ICD-10-CM

## 2018-02-26 LAB — URINALYSIS, COMPLETE (UACMP) WITH MICROSCOPIC
BILIRUBIN URINE: NEGATIVE
Bacteria, UA: NONE SEEN
Glucose, UA: NEGATIVE mg/dL
HGB URINE DIPSTICK: NEGATIVE
Ketones, ur: NEGATIVE mg/dL
Leukocytes, UA: NEGATIVE
NITRITE: NEGATIVE
Protein, ur: NEGATIVE mg/dL
Specific Gravity, Urine: 1.013 (ref 1.005–1.030)
Squamous Epithelial / HPF: NONE SEEN (ref 0–5)
pH: 7 (ref 5.0–8.0)

## 2018-02-26 MED ORDER — GEMCITABINE CHEMO FOR BLADDER INSTILLATION 2000 MG
2000.0000 mg | Freq: Once | INTRAVENOUS | Status: AC
  Administered 2018-02-26: 2000 mg via INTRAVESICAL
  Filled 2018-02-26: qty 52.6

## 2018-02-26 MED ORDER — PROCHLORPERAZINE MALEATE 10 MG PO TABS
10.0000 mg | ORAL_TABLET | Freq: Once | ORAL | Status: AC
  Administered 2018-02-26: 10 mg via ORAL
  Filled 2018-02-26: qty 1

## 2018-03-05 ENCOUNTER — Inpatient Hospital Stay: Payer: Medicare Other

## 2018-03-05 VITALS — BP 106/68 | HR 74 | Temp 95.6°F | Resp 18

## 2018-03-05 DIAGNOSIS — Z5111 Encounter for antineoplastic chemotherapy: Secondary | ICD-10-CM | POA: Diagnosis not present

## 2018-03-05 DIAGNOSIS — C689 Malignant neoplasm of urinary organ, unspecified: Secondary | ICD-10-CM

## 2018-03-05 LAB — URINALYSIS, COMPLETE (UACMP) WITH MICROSCOPIC
BILIRUBIN URINE: NEGATIVE
Bacteria, UA: NONE SEEN
Glucose, UA: NEGATIVE mg/dL
HGB URINE DIPSTICK: NEGATIVE
Ketones, ur: NEGATIVE mg/dL
Leukocytes,Ua: NEGATIVE
NITRITE: NEGATIVE
Protein, ur: NEGATIVE mg/dL
Specific Gravity, Urine: 1.014 (ref 1.005–1.030)
pH: 7 (ref 5.0–8.0)

## 2018-03-05 MED ORDER — SODIUM CHLORIDE 0.9 % IV SOLN
Freq: Once | INTRAVENOUS | Status: DC
  Filled 2018-03-05: qty 250

## 2018-03-05 MED ORDER — GEMCITABINE CHEMO FOR BLADDER INSTILLATION 2000 MG
2000.0000 mg | Freq: Once | INTRAVENOUS | Status: AC
  Administered 2018-03-05: 2000 mg via INTRAVESICAL
  Filled 2018-03-05: qty 52.6

## 2018-03-05 MED ORDER — PROCHLORPERAZINE MALEATE 10 MG PO TABS
10.0000 mg | ORAL_TABLET | Freq: Once | ORAL | Status: AC
  Administered 2018-03-05: 10 mg via ORAL
  Filled 2018-03-05: qty 1

## 2018-03-12 ENCOUNTER — Inpatient Hospital Stay: Payer: Medicare Other | Attending: Oncology

## 2018-03-12 VITALS — BP 120/72 | HR 67 | Temp 95.1°F | Resp 20

## 2018-03-12 DIAGNOSIS — C689 Malignant neoplasm of urinary organ, unspecified: Secondary | ICD-10-CM

## 2018-03-12 DIAGNOSIS — Z5111 Encounter for antineoplastic chemotherapy: Secondary | ICD-10-CM | POA: Diagnosis present

## 2018-03-12 DIAGNOSIS — C679 Malignant neoplasm of bladder, unspecified: Secondary | ICD-10-CM | POA: Insufficient documentation

## 2018-03-12 LAB — URINALYSIS, COMPLETE (UACMP) WITH MICROSCOPIC
Bacteria, UA: NONE SEEN
Bilirubin Urine: NEGATIVE
Glucose, UA: NEGATIVE mg/dL
Hgb urine dipstick: NEGATIVE
Ketones, ur: NEGATIVE mg/dL
Leukocytes,Ua: NEGATIVE
Nitrite: NEGATIVE
Protein, ur: NEGATIVE mg/dL
SPECIFIC GRAVITY, URINE: 1.01 (ref 1.005–1.030)
pH: 7 (ref 5.0–8.0)

## 2018-03-12 MED ORDER — GEMCITABINE CHEMO FOR BLADDER INSTILLATION 2000 MG
2000.0000 mg | Freq: Once | INTRAVENOUS | Status: AC
  Administered 2018-03-12: 2000 mg via INTRAVESICAL
  Filled 2018-03-12: qty 52.6

## 2018-03-12 MED ORDER — PROCHLORPERAZINE MALEATE 10 MG PO TABS
10.0000 mg | ORAL_TABLET | Freq: Once | ORAL | Status: AC
  Administered 2018-03-12: 10 mg via ORAL
  Filled 2018-03-12: qty 1

## 2018-03-13 ENCOUNTER — Encounter: Payer: Self-pay | Admitting: Urology

## 2018-03-13 ENCOUNTER — Ambulatory Visit: Payer: Medicare Other | Admitting: Urology

## 2018-03-13 VITALS — BP 157/80 | HR 87 | Ht 68.0 in | Wt 169.0 lb

## 2018-03-13 DIAGNOSIS — C679 Malignant neoplasm of bladder, unspecified: Secondary | ICD-10-CM

## 2018-03-13 LAB — URINALYSIS, COMPLETE
Bilirubin, UA: NEGATIVE
GLUCOSE, UA: NEGATIVE
KETONES UA: NEGATIVE
Leukocytes, UA: NEGATIVE
Nitrite, UA: NEGATIVE
Specific Gravity, UA: 1.025 (ref 1.005–1.030)
Urobilinogen, Ur: 0.2 mg/dL (ref 0.2–1.0)
pH, UA: 6 (ref 5.0–7.5)

## 2018-03-13 LAB — MICROSCOPIC EXAMINATION
Bacteria, UA: NONE SEEN
Epithelial Cells (non renal): NONE SEEN /hpf (ref 0–10)
WBC, UA: NONE SEEN /hpf (ref 0–5)

## 2018-03-13 NOTE — Progress Notes (Signed)
Cystoscopy Procedure Note:  Indication: Hx of LG Ta high volume, s/p TURBT x2(10/2017, 11/2017) and induction gemcitabine x6  After informed consent and discussion of the procedure and its risks, Derron Pipkins. was positioned and prepped in the standard fashion. Cystoscopy was performed with a flexible cystoscope. The urethra, bladder neck and entire bladder was visualized in a standard fashion. The prostate was moderate with an intravesical component. Bladder mucosa grossly normal throughout, no suspicious tumors or lesions. The ureteral orifices were visualized in their normal location and orientation. Retroflexion with intravesical protrusion of prostate.  Findings: Normal cystoscopy  Assessment and Plan: Repeat cysto in 9 months  Nickolas Madrid, MD 03/13/2018

## 2018-10-28 ENCOUNTER — Other Ambulatory Visit: Payer: Self-pay

## 2018-10-28 DIAGNOSIS — Z20822 Contact with and (suspected) exposure to covid-19: Secondary | ICD-10-CM

## 2018-10-30 LAB — NOVEL CORONAVIRUS, NAA: SARS-CoV-2, NAA: NOT DETECTED

## 2018-11-25 ENCOUNTER — Other Ambulatory Visit: Payer: Self-pay

## 2018-11-25 DIAGNOSIS — Z20822 Contact with and (suspected) exposure to covid-19: Secondary | ICD-10-CM

## 2018-11-27 LAB — NOVEL CORONAVIRUS, NAA: SARS-CoV-2, NAA: NOT DETECTED

## 2018-12-08 ENCOUNTER — Encounter: Payer: Self-pay | Admitting: Urology

## 2018-12-08 ENCOUNTER — Other Ambulatory Visit: Payer: Self-pay | Admitting: Radiology

## 2018-12-08 ENCOUNTER — Other Ambulatory Visit: Payer: Self-pay

## 2018-12-08 ENCOUNTER — Ambulatory Visit: Payer: Medicare Other | Admitting: Urology

## 2018-12-08 VITALS — BP 137/83 | HR 80 | Ht 68.0 in | Wt 167.0 lb

## 2018-12-08 DIAGNOSIS — D494 Neoplasm of unspecified behavior of bladder: Secondary | ICD-10-CM

## 2018-12-08 DIAGNOSIS — C679 Malignant neoplasm of bladder, unspecified: Secondary | ICD-10-CM | POA: Diagnosis not present

## 2018-12-08 LAB — URINALYSIS, COMPLETE
Bilirubin, UA: NEGATIVE
Glucose, UA: NEGATIVE
Ketones, UA: NEGATIVE
Leukocytes,UA: NEGATIVE
Nitrite, UA: NEGATIVE
Specific Gravity, UA: 1.02 (ref 1.005–1.030)
Urobilinogen, Ur: 0.2 mg/dL (ref 0.2–1.0)
pH, UA: 6.5 (ref 5.0–7.5)

## 2018-12-08 LAB — MICROSCOPIC EXAMINATION
Bacteria, UA: NONE SEEN
RBC, Urine: >30 /hpf — ABNORMAL HIGH (ref 0–2)

## 2018-12-08 MED ORDER — SODIUM CHLORIDE 0.9 % IR SOLN: 2000.0000 mg | Freq: Once | Status: DC

## 2018-12-08 MED ORDER — LIDOCAINE HCL URETHRAL/MUCOSAL 2 % EX GEL
1.0000 "application " | Freq: Once | CUTANEOUS | Status: AC
  Administered 2018-12-08: 1 via URETHRAL

## 2018-12-08 NOTE — Progress Notes (Signed)
Cystoscopy Procedure Note:  Indication: Hx of LG Ta high volume >5cm, s/p TURBT x2(10/2017, 11/2017) and induction gemcitabine x6  After informed consent and discussion of the procedure and its risks, Shawn Hardy. was positioned and prepped in the standard fashion. Cystoscopy was performed with a flexible cystoscope. The urethra, bladder neck and entire bladder was visualized in a standard fashion. The prostate was moderate in size with an intravesical component. Multiple small <1cm papillary LG appearing recurrences at the dome and posterior wall. The ureteral orifices were visualized in their normal location and orientation. Retroflexion with intravesical protrusion of prostate.  Findings: Recurrence of multiple small <1cm papillary appearing recurrences  Assessment and Plan: Schedule TURBT with gemcitabine, anticipate induction BCG or Gemcitabine post-op

## 2018-12-11 ENCOUNTER — Other Ambulatory Visit: Payer: Medicare Other | Admitting: Urology

## 2018-12-12 ENCOUNTER — Other Ambulatory Visit: Payer: Self-pay

## 2018-12-12 ENCOUNTER — Encounter
Admission: RE | Admit: 2018-12-12 | Discharge: 2018-12-12 | Disposition: A | Discharge status: Home / Self Care | Payer: Medicare Other | Source: Ambulatory Visit | Attending: Urology | Admitting: Urology

## 2018-12-12 DIAGNOSIS — I10 Essential (primary) hypertension: Secondary | ICD-10-CM | POA: Insufficient documentation

## 2018-12-12 DIAGNOSIS — Z01818 Encounter for other preprocedural examination: Secondary | ICD-10-CM | POA: Insufficient documentation

## 2018-12-12 LAB — POTASSIUM: Potassium: 3.3 mmol/L — ABNORMAL LOW (ref 3.5–5.1)

## 2018-12-12 NOTE — Patient Instructions (Signed)
Your procedure is scheduled on: 12-26-18 FRIDAY Report to Same Day Surgery 2nd floor medical mall [facility] Entrance-take elevator on left to 2nd floor.  Check in with surgery information desk.) To find out your arrival time please call [phone] between 1PM - 3PM on 12-25-18 THURSDAY  Remember: Instructions that are not followed completely may result in serious medical risk, up to and including death, or upon the discretion of your surgeon and anesthesiologist your surgery may need to be rescheduled.    _x___ 1. Do not eat food after midnight the night before your procedure. NO GUM OR CANDY AFTER MIDNIGHT. You may drink clear liquids up to 2 hours before you are scheduled to arrive at the hospital for your procedure.  Do not drink clear liquids within 2 hours of your scheduled arrival to the hospital.  Clear liquids include  --Water or Apple juice without pulp  --Gatorade  --Black Coffee or Clear Tea (No milk, no creamers, do not add anything to the coffee or Tea   ____Ensure clear carbohydrate drink on the way to the hospital for bariatric patients  ____Ensure clear carbohydrate drink 3 hours before surgery.     __x__ 2. No Alcohol for 24 hours before or after surgery.   __x__3. No Smoking or e-cigarettes for 24 prior to surgery.  Do not use any chewable tobacco products for at least 6 hour prior to surgery   ____  4. Bring all medications with you on the day of surgery if instructed.    __x__ 5. Notify your doctor if there is any change in your medical condition     (cold, fever, infections).    x___6. On the morning of surgery brush your teeth with toothpaste and water.  You may rinse your mouth with mouth wash if you wish.  Do not swallow any toothpaste or mouthwash.   Do not wear jewelry, make-up, hairpins, clips or nail polish.  Do not wear lotions, powders, or perfumes. You may wear deodorant.  Do not shave 48 hours prior to surgery. Men may shave face and neck.  Do  not bring valuables to the hospital.    [facility] is not responsible for any belongings or valuables.               Contacts, dentures or bridgework may not be worn into surgery.  Leave your suitcase in the car. After surgery it may be brought to your room.  For patients admitted to the hospital, discharge time is determined by your  treatment team.  _  Patients discharged the day of surgery will not be allowed to drive home.  You will need someone to drive you home and stay with you the night of your procedure.    Please read over the following fact sheets that you were given:   [facility] Preparing for Surgery and or MRSA Information   _x___ TAKE THE FOLLOWING MEDICATION THE MORNING OF SURGERY WITH A SMALL SIP OF WATER. These include:  1. NORVASC (AMLODIPINE)  2.  3.  4.  5.  6.  ____Fleets enema or Magnesium Citrate as directed.   ____ Use CHG Soap or sage wipes as directed on instruction sheet   ____ Use inhalers on the day of surgery and bring to hospital day of surgery  ____ Stop Metformin and Janumet 2 days prior to surgery.    ____ Take 1/2 of usual insulin dose the night before surgery and none on the morning surgery.  ____ Follow recommendations from Cardiologist, Pulmonologist or PCP regarding stopping Aspirin, Coumadin, Plavix ,Eliquis, Effient, or Pradaxa, and Pletal.  X____Stop Anti-inflammatories such as Advil, Aleve, Ibuprofen, Motrin, Naproxen, Naprosyn, Goodies powders or aspirin products 7 DAYS PRIOR TO SURGERY-OK to take Tylenol    ____ Stop supplements until after surgery.     ____ Bring C-Pap to the hospital.

## 2018-12-15 ENCOUNTER — Other Ambulatory Visit: Payer: Self-pay | Admitting: Radiology

## 2018-12-15 ENCOUNTER — Telehealth: Payer: Self-pay | Admitting: Radiology

## 2018-12-15 NOTE — Telephone Encounter (Signed)
-----   Message from Billey Co, MD sent at 12/15/2018  9:28 AM EST ----- Lets replace this with oral potassium daily 52mEq BID, then re-check AM of surgery, thanks  Nickolas Madrid, MD 12/15/2018

## 2018-12-15 NOTE — Telephone Encounter (Signed)
Sounds good, thanks  Nickolas Madrid, MD 12/15/2018

## 2018-12-15 NOTE — Telephone Encounter (Signed)
Patient states he has not been taking his potassium supplement as prescribed by another provider. Encouraged patient to resume as prescribed and potassium level will be rechecked on the morning of surgery. Questions answered.Patient expresses understanding of conversation.

## 2018-12-16 ENCOUNTER — Other Ambulatory Visit: Payer: Self-pay | Admitting: *Deleted

## 2018-12-16 DIAGNOSIS — C679 Malignant neoplasm of bladder, unspecified: Secondary | ICD-10-CM

## 2018-12-17 ENCOUNTER — Other Ambulatory Visit: Payer: Medicare Other

## 2018-12-17 ENCOUNTER — Other Ambulatory Visit: Payer: Self-pay

## 2018-12-17 DIAGNOSIS — C679 Malignant neoplasm of bladder, unspecified: Secondary | ICD-10-CM

## 2018-12-17 LAB — MICROSCOPIC EXAMINATION
Bacteria, UA: NONE SEEN
Epithelial Cells (non renal): NONE SEEN /hpf (ref 0–10)
RBC, Urine: NONE SEEN /hpf (ref 0–2)

## 2018-12-17 LAB — URINALYSIS, COMPLETE
Bilirubin, UA: NEGATIVE
Glucose, UA: NEGATIVE
Ketones, UA: NEGATIVE
Leukocytes,UA: NEGATIVE
Nitrite, UA: NEGATIVE
RBC, UA: NEGATIVE
Specific Gravity, UA: 1.02 (ref 1.005–1.030)
Urobilinogen, Ur: 0.2 mg/dL (ref 0.2–1.0)
pH, UA: 7 (ref 5.0–7.5)

## 2018-12-21 LAB — CULTURE, URINE COMPREHENSIVE

## 2018-12-23 ENCOUNTER — Other Ambulatory Visit
Admission: RE | Admit: 2018-12-23 | Discharge: 2018-12-23 | Disposition: A | Discharge status: Home / Self Care | Payer: Medicare Other | Source: Ambulatory Visit | Attending: Urology | Admitting: Urology

## 2018-12-23 ENCOUNTER — Other Ambulatory Visit: Payer: Self-pay

## 2018-12-23 DIAGNOSIS — Z20828 Contact with and (suspected) exposure to other viral communicable diseases: Secondary | ICD-10-CM | POA: Diagnosis not present

## 2018-12-23 DIAGNOSIS — Z01812 Encounter for preprocedural laboratory examination: Secondary | ICD-10-CM | POA: Diagnosis present

## 2018-12-23 LAB — SARS CORONAVIRUS 2 (TAT 6-24 HRS): SARS Coronavirus 2: NEGATIVE

## 2018-12-26 ENCOUNTER — Encounter: Payer: Self-pay | Admitting: *Deleted

## 2018-12-26 ENCOUNTER — Ambulatory Visit: Payer: Medicare Other | Admitting: Certified Registered Nurse Anesthetist

## 2018-12-26 ENCOUNTER — Ambulatory Visit
Admission: RE | Admit: 2018-12-26 | Discharge: 2018-12-26 | Disposition: A | Discharge status: Home / Self Care | Payer: Medicare Other | Attending: Urology | Admitting: Urology

## 2018-12-26 ENCOUNTER — Other Ambulatory Visit: Payer: Self-pay

## 2018-12-26 ENCOUNTER — Encounter
Admission: RE | Disposition: A | Discharge status: Home / Self Care | Payer: Self-pay | Source: Home / Self Care | Attending: Urology

## 2018-12-26 DIAGNOSIS — E785 Hyperlipidemia, unspecified: Secondary | ICD-10-CM | POA: Diagnosis not present

## 2018-12-26 DIAGNOSIS — Z8249 Family history of ischemic heart disease and other diseases of the circulatory system: Secondary | ICD-10-CM | POA: Diagnosis not present

## 2018-12-26 DIAGNOSIS — Z79899 Other long term (current) drug therapy: Secondary | ICD-10-CM | POA: Insufficient documentation

## 2018-12-26 DIAGNOSIS — I10 Essential (primary) hypertension: Secondary | ICD-10-CM | POA: Diagnosis not present

## 2018-12-26 DIAGNOSIS — D494 Neoplasm of unspecified behavior of bladder: Secondary | ICD-10-CM

## 2018-12-26 DIAGNOSIS — Z8551 Personal history of malignant neoplasm of bladder: Secondary | ICD-10-CM | POA: Diagnosis not present

## 2018-12-26 DIAGNOSIS — C679 Malignant neoplasm of bladder, unspecified: Secondary | ICD-10-CM | POA: Diagnosis not present

## 2018-12-26 HISTORY — PX: TRANSURETHRAL RESECTION OF BLADDER TUMOR WITH MITOMYCIN-C: SHX6459

## 2018-12-26 LAB — POCT I-STAT, CHEM 8
BUN: 17 mg/dL (ref 8–23)
Calcium, Ion: 1.27 mmol/L (ref 1.15–1.40)
Chloride: 104 mmol/L (ref 98–111)
Creatinine, Ser: 1.5 mg/dL — ABNORMAL HIGH (ref 0.61–1.24)
Glucose, Bld: 95 mg/dL (ref 70–99)
HCT: 45 % (ref 39.0–52.0)
Hemoglobin: 15.3 g/dL (ref 13.0–17.0)
Potassium: 3.5 mmol/L (ref 3.5–5.1)
Sodium: 141 mmol/L (ref 135–145)
TCO2: 25 mmol/L (ref 22–32)

## 2018-12-26 SURGERY — TRANSURETHRAL RESECTION OF BLADDER TUMOR WITH MITOMYCIN-C
Anesthesia: General

## 2018-12-26 MED ORDER — ONDANSETRON HCL 4 MG/2ML IJ SOLN
INTRAMUSCULAR | Status: AC
  Filled 2018-12-26: qty 2

## 2018-12-26 MED ORDER — DEXAMETHASONE SODIUM PHOSPHATE 10 MG/ML IJ SOLN
INTRAMUSCULAR | Status: DC | PRN
  Administered 2018-12-26: 10 mg via INTRAVENOUS

## 2018-12-26 MED ORDER — ONDANSETRON HCL 4 MG/2ML IJ SOLN
4.0000 mg | Freq: Once | INTRAMUSCULAR | Status: AC | PRN
  Administered 2018-12-26: 16:00:00 4 mg via INTRAVENOUS

## 2018-12-26 MED ORDER — PROPOFOL 10 MG/ML IV BOLUS
INTRAVENOUS | Status: DC | PRN
  Administered 2018-12-26: 140 mg via INTRAVENOUS

## 2018-12-26 MED ORDER — HYDROCODONE-ACETAMINOPHEN 5-325 MG PO TABS: 1.0000 | ORAL_TABLET | ORAL | 0 refills | Status: AC | PRN

## 2018-12-26 MED ORDER — CEFAZOLIN SODIUM-DEXTROSE 2-4 GM/100ML-% IV SOLN
INTRAVENOUS | Status: AC
  Filled 2018-12-26: qty 100

## 2018-12-26 MED ORDER — PROPOFOL 10 MG/ML IV BOLUS
INTRAVENOUS | Status: AC
  Filled 2018-12-26: qty 20

## 2018-12-26 MED ORDER — ONDANSETRON HCL 4 MG/2ML IJ SOLN
INTRAMUSCULAR | Status: DC | PRN
  Administered 2018-12-26: 4 mg via INTRAVENOUS

## 2018-12-26 MED ORDER — FENTANYL CITRATE (PF) 100 MCG/2ML IJ SOLN
INTRAMUSCULAR | Status: AC
  Filled 2018-12-26: qty 2

## 2018-12-26 MED ORDER — FAMOTIDINE 20 MG PO TABS
20.0000 mg | ORAL_TABLET | Freq: Once | ORAL | Status: AC
  Administered 2018-12-26: 20 mg via ORAL

## 2018-12-26 MED ORDER — FAMOTIDINE 20 MG PO TABS
ORAL_TABLET | ORAL | Status: AC
  Filled 2018-12-26: qty 1

## 2018-12-26 MED ORDER — FENTANYL CITRATE (PF) 100 MCG/2ML IJ SOLN
INTRAMUSCULAR | Status: DC | PRN
  Administered 2018-12-26 (×4): 25 ug via INTRAVENOUS

## 2018-12-26 MED ORDER — LACTATED RINGERS IV SOLN
INTRAVENOUS | Status: DC
  Administered 2018-12-26: 12:00:00 via INTRAVENOUS

## 2018-12-26 MED ORDER — GEMCITABINE CHEMO FOR BLADDER INSTILLATION 2000 MG
INTRAVENOUS | Status: DC | PRN
  Administered 2018-12-26: 2000 mg via INTRAVESICAL

## 2018-12-26 MED ORDER — DEXAMETHASONE SODIUM PHOSPHATE 4 MG/ML IJ SOLN
INTRAMUSCULAR | Status: AC
  Filled 2018-12-26: qty 1

## 2018-12-26 MED ORDER — CEFAZOLIN SODIUM-DEXTROSE 2-4 GM/100ML-% IV SOLN
2.0000 g | INTRAVENOUS | Status: AC
  Administered 2018-12-26: 2 g via INTRAVENOUS

## 2018-12-26 MED ORDER — FENTANYL CITRATE (PF) 100 MCG/2ML IJ SOLN: 25.0000 ug | INTRAMUSCULAR | Status: DC | PRN

## 2018-12-26 SURGICAL SUPPLY — 28 items
BAG DRAIN CYSTO-URO LG1000N (MISCELLANEOUS) ×2 IMPLANT
BAG URINE DRAIN 2000ML AR STRL (UROLOGICAL SUPPLIES) ×2 IMPLANT
BRUSH SCRUB EZ  4% CHG (MISCELLANEOUS) ×1
BRUSH SCRUB EZ 4% CHG (MISCELLANEOUS) ×1 IMPLANT
CATH FOL 2WAY LX 18X30 (CATHETERS) ×2 IMPLANT
CRADLE LAMINECT ARM (MISCELLANEOUS) ×2 IMPLANT
DRAPE UTILITY 15X26 TOWEL STRL (DRAPES) ×2 IMPLANT
DRSG TELFA 4X3 1S NADH ST (GAUZE/BANDAGES/DRESSINGS) ×2 IMPLANT
ELECT LOOP 22F BIPOLAR SML (ELECTROSURGICAL)
ELECT REM PT RETURN 9FT ADLT (ELECTROSURGICAL) ×2
ELECTRODE LOOP 22F BIPOLAR SML (ELECTROSURGICAL) IMPLANT
ELECTRODE REM PT RTRN 9FT ADLT (ELECTROSURGICAL) ×1 IMPLANT
GLOVE BIOGEL PI IND STRL 7.5 (GLOVE) ×3 IMPLANT
GLOVE BIOGEL PI INDICATOR 7.5 (GLOVE) ×3
GOWN STRL REUS W/ TWL LRG LVL3 (GOWN DISPOSABLE) ×1 IMPLANT
GOWN STRL REUS W/ TWL XL LVL3 (GOWN DISPOSABLE) ×1 IMPLANT
GOWN STRL REUS W/TWL LRG LVL3 (GOWN DISPOSABLE) ×1
GOWN STRL REUS W/TWL XL LVL3 (GOWN DISPOSABLE) ×1
KIT TURNOVER CYSTO (KITS) ×2 IMPLANT
LOOP CUT BIPOLAR 24F LRG (ELECTROSURGICAL) IMPLANT
NDL SAFETY ECLIPSE 18X1.5 (NEEDLE) ×1 IMPLANT
NEEDLE HYPO 18GX1.5 SHARP (NEEDLE) ×1
PACK CYSTO AR (MISCELLANEOUS) ×2 IMPLANT
SET IRRIG Y TYPE TUR BLADDER L (SET/KITS/TRAYS/PACK) ×2 IMPLANT
SURGILUBE 2OZ TUBE FLIPTOP (MISCELLANEOUS) ×2 IMPLANT
SYRINGE IRR TOOMEY STRL 70CC (SYRINGE) ×2 IMPLANT
WATER STERILE IRR 1000ML POUR (IV SOLUTION) ×2 IMPLANT
WATER STERILE IRR 3000ML UROMA (IV SOLUTION) ×6 IMPLANT

## 2018-12-26 NOTE — H&P (Signed)
12/26/18 1:40 PM   Shawn Hardy. 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  Routine surveillance cystoscopy in clinic on 12/08/2018 showed multiple small <1cm recurrences of papillary appearing tumor.  He denies any chest pain, shortness of breath, dysuria, or gross hematuria.   PMH: Past Medical History:  Diagnosis Date  . Bladder cancer ([place])   . ED (erectile dysfunction)   . Glaucoma    left eye  . Hematuria   . Hyperlipidemia   . Hypertension   . Hypokalemia     Surgical History: Past Surgical History:  Procedure Laterality Date  . COLONOSCOPY WITH PROPOFOL N/A 09/17/2016   Procedure: COLONOSCOPY WITH PROPOFOL;  Surgeon: Lollie Sails, MD;  Location: [facility] ENDOSCOPY;  Service: Endoscopy;  Laterality: N/A;  . EYE SURGERY Left    cataract extraction  . KNEE SURGERY Right 1980   arthroscopy  . TRANSURETHRAL RESECTION OF BLADDER TUMOR N/A 11/01/2017   Procedure: TRANSURETHRAL RESECTION OF BLADDER TUMOR (TURBT);  Surgeon: Billey Co, MD;  Location: [facility] ORS;  Service: Urology;  Laterality: N/A;  . TRANSURETHRAL RESECTION OF BLADDER TUMOR WITH MITOMYCIN-C N/A 12/11/2017   Procedure: TRANSURETHRAL RESECTION OF BLADDER TUMOR WITH gemcitabine;  Surgeon: Billey Co, MD;  Location: [facility] ORS;  Service: Urology;  Laterality: N/A;    Allergies: No Known Allergies  Family History: Family History  Problem Relation Age of Onset  . Cirrhosis Father   . Heart attack Mother     Social History:  reports that he has never smoked. He has never used smokeless tobacco. He reports current alcohol use. He reports that he does not use drugs.  Physical Exam: BP (!) 146/83   Pulse 76   Temp 98.7 F (37.1 C) (Tympanic)   Resp 18   Ht 5\' 8"  (1.727 m)   Wt 78 kg   SpO2 100%   BMI 26.15 kg/m    Constitutional:  Alert and oriented, No acute distress. Cardiovascular: Clear to auscultation bilaterally Respiratory: Regular rate and rhythm  GI: Abdomen is soft, nontender, nondistended, no abdominal masses Lymph: No cervical or inguinal lymphadenopathy. Skin: No rashes, bruises or suspicious lesions. Neurologic: Grossly intact, no focal deficits, moving all 4 extremities. Psychiatric: Normal mood and affect.  Laboratory Data: Urine culture 11/25 no growth  Assessment & Plan:   73 year old male with history of low-grade Ta bladder cancer originally resected in October and November 2019, with postop induction course of gemcitabine.  Recent surveillance cystoscopy showed multiple small <1 cm papillary recurrences, and he presents today for resection.  We discussed transurethral resection of bladder tumor (TURBT) and risks and benefits at length. This is typically a 1 to 2-hour procedure done under general anesthesia in the operating room.  A scope is inserted through the urethra and used to resect abnormal tissue within the bladder, which is then sent to the pathologist to determine grade and stage of the tumor.  Risks include bleeding, infection, need for temporary Foley placement, and bladder perforation.  Treatment strategies are based on the type of tumor and depth of invasion.  We briefly reviewed the different treatment pathways for non-muscle invasive and muscle invasive bladder cancer.  TURBT with gemcitabine today Consider BCG or gemcitabine induction pending pathology results  Billey Co, MD  [facility] [address], [place],  [place] [address] [phone]

## 2018-12-26 NOTE — Transfer of Care (Signed)
Immediate Anesthesia Transfer of Care Note  Patient: Shawn Hardy.  Procedure(s) Performed: TRANSURETHRAL RESECTION OF BLADDER TUMOR WITH gemcitabine (N/A )  Patient Location: PACU  Anesthesia Type:General  Level of Consciousness: awake, drowsy and patient cooperative  Airway & Oxygen Therapy: Patient Spontanous Breathing and Patient connected to face mask oxygen  Post-op Assessment: Report given to RN and Post -op Vital signs reviewed and stable  Post vital signs: Reviewed and stable  Last Vitals:  Vitals Value Taken Time  BP 129/102 12/26/18 1502  Temp    Pulse 79 12/26/18 1502  Resp 16 12/26/18 1502  SpO2 100 % 12/26/18 1502  Vitals shown include unvalidated device data.  Last Pain:  Vitals:   12/26/18 1111  TempSrc: Tympanic  PainSc: 0-No pain         Complications: No apparent anesthesia complications

## 2018-12-26 NOTE — Op Note (Signed)
Date of procedure: 12/26/18  Preoperative diagnosis:  1. Bladder tumor  Postoperative diagnosis:  1. Same  Procedure: 1. Cystoscopy, bladder biopsy and fulguration 2. Instillation of gemcitabine  Surgeon: Nickolas Madrid, MD  Anesthesia: General  Complications: None  Intraoperative findings:  1. ~8 small <1cm papillary appearing bladder tumors scattered throughout the bladder, primarily dome and posterior wall 2.  Ureteral orifice ease orthotopic and not involved with tumor 3.  Excellent hemostasis  EBL: Minimal  Specimens: Bladder tumors  Drains: 18 French two-way Foley  Indication: Shawn Hardy. is a 73 y.o. patient with history of low-grade Ta bladder cancer originally diagnosed in October 2019.  On recent surveillance cystoscopy he was found to have multiple small recurrences.  After reviewing the management options for treatment, they elected to proceed with the above surgical procedure(s). We have discussed the potential benefits and risks of the procedure, side effects of the proposed treatment, the likelihood of the patient achieving the goals of the procedure, and any potential problems that might occur during the procedure or recuperation. Informed consent has been obtained.  Description of procedure:  The patient was taken to the operating room and general anesthesia was induced. SCDs were placed for DVT prophylaxis. The patient was placed in the dorsal lithotomy position, prepped and draped in the usual sterile fashion, and preoperative antibiotics were administered. A preoperative time-out was performed.   A 21 French rigid cystoscope was used to intubate the urethra and a normal-appearing urethra was followed proximally into the bladder.  The prostate was small in size.  Thorough cystoscopy was performed and demonstrated ureteral orifices orthotopic bilaterally not involved with tumor, and multiple ~8 very small <1cm papillary appearing bladder tumors throughout the  bladder, primarily focused at the posterior wall and dome.  The cold cup rigid biopsy forceps were used to pluck and remove all papillary tumors.  The Bugbee was then used for meticulous hemostasis.  Thorough inspection of the bladder revealed no residual lesions.  All abnormal mucosa had been biopsied and fulgurated.  An 62 French Foley catheter was placed.  Disposition: Stable to PACU  Plan: -2g/38mL gemcitabine was instilled into the bladder via the catheter, and the catheter clamped.  This will remain in the bladder for 1 hour in PACU, then will be drained and the catheter removed. -Follow-up in clinic in 10 days to discuss pathology results, anticipate repeat induction BCG/gemcitabine and maintenance  Nickolas Madrid, MD

## 2018-12-26 NOTE — Anesthesia Post-op Follow-up Note (Signed)
Anesthesia QCDR form completed.        

## 2018-12-26 NOTE — Discharge Instructions (Signed)

## 2018-12-26 NOTE — Anesthesia Preprocedure Evaluation (Addendum)
Anesthesia Evaluation  Patient identified by MRN, date of birth, ID band Patient awake    Reviewed: Allergy & Precautions, NPO status , Patient's Chart, lab work & pertinent test results  History of Anesthesia Complications Negative for: history of anesthetic complications  Airway Mallampati: II       Dental   Pulmonary neg sleep apnea, neg COPD, Not current smoker,           Cardiovascular hypertension, Pt. on medications (-) Past MI and (-) CHF (-) dysrhythmias (-) Valvular Problems/Murmurs     Neuro/Psych neg Seizures    GI/Hepatic Neg liver ROS, neg GERD  ,  Endo/Other  neg diabetes  Renal/GU negative Renal ROS     Musculoskeletal   Abdominal   Peds  Hematology   Anesthesia Other Findings   Reproductive/Obstetrics                             Anesthesia Physical Anesthesia Plan  ASA: II  Anesthesia Plan: General   Post-op Pain Management:    Induction: Intravenous  PONV Risk Score and Plan: 2 and Ondansetron and Dexamethasone  Airway Management Planned: LMA  Additional Equipment:   Intra-op Plan:   Post-operative Plan:   Informed Consent: I have reviewed the patients History and Physical, chart, labs and discussed the procedure including the risks, benefits and alternatives for the proposed anesthesia with the patient or authorized representative who has indicated his/her understanding and acceptance.       Plan Discussed with:   Anesthesia Plan Comments:        Anesthesia Quick Evaluation  

## 2018-12-26 NOTE — Anesthesia Procedure Notes (Signed)
Procedure Name: LMA Insertion Date/Time: 12/26/2018 2:09 PM Performed by: Eben Burow, CRNA Pre-anesthesia Checklist: Patient identified, Emergency Drugs available, Suction available and Patient being monitored Patient Re-evaluated:Patient Re-evaluated prior to induction Oxygen Delivery Method: Circle system utilized Preoxygenation: Pre-oxygenation with 100% oxygen Induction Type: IV induction LMA: LMA inserted LMA Size: 4.5 Number of attempts: 1 Placement Confirmation: positive ETCO2 and breath sounds checked- equal and bilateral Tube secured with: Tape Dental Injury: Teeth and Oropharynx as per pre-operative assessment

## 2018-12-29 ENCOUNTER — Encounter: Payer: Self-pay | Admitting: Urology

## 2018-12-29 NOTE — Anesthesia Postprocedure Evaluation (Signed)
Anesthesia Post Note  Patient: Shawn Hardy.  Procedure(s) Performed: TRANSURETHRAL RESECTION OF BLADDER TUMOR WITH gemcitabine (N/A )  Patient location during evaluation: PACU Anesthesia Type: General Level of consciousness: awake and alert Pain management: pain level controlled Vital Signs Assessment: post-procedure vital signs reviewed and stable Respiratory status: spontaneous breathing, nonlabored ventilation, respiratory function stable and patient connected to nasal cannula oxygen Cardiovascular status: blood pressure returned to baseline and stable Postop Assessment: no apparent nausea or vomiting Anesthetic complications: no     Last Vitals:  Vitals:   12/26/18 1600 12/26/18 1616  BP: (!) 146/78 (!) 147/78  Pulse: 70 78  Resp: 12 14  Temp: (!) 36.3 C   SpO2: 99% 97%    Last Pain:  Vitals:   12/26/18 1616  TempSrc:   PainSc: 0-No pain                 Precious Haws [name]

## 2018-12-30 LAB — SURGICAL PATHOLOGY

## 2019-01-03 ENCOUNTER — Other Ambulatory Visit: Payer: Self-pay | Admitting: Urology

## 2019-01-03 DIAGNOSIS — C678 Malignant neoplasm of overlapping sites of bladder: Secondary | ICD-10-CM

## 2019-01-03 NOTE — Progress Notes (Signed)
Urology telephone note  I contacted Mr. Koopmann today to discuss his recent pathology from bladder biopsy and fulguration.  He is a 73 year old male with history of > 5 cm low-grade Ta urothelial cell carcinoma resected in October 2019, re-resection in November 2019, followed by induction gemcitabine x6 with oncology.  He was noted to have recurrence on recent cystoscopy in clinic on 12/08/2018 with multiple small <1 cm papillary tumors, and underwent uncomplicated resection and fulguration on 12/26/2018 with post-op intravesical gemcitabine.  Pathology again shows low-grade noninvasive papillary urothelial cell carcinoma.  We discussed his pathology results at length today.  I recommended repeat induction intravesical gemcitabine with oncology, and a referral was placed to Dr. Grayland Ormond.  He has scheduled follow-up with me in March 2021 for surveillance cystoscopy.  Repeat induction intravesical gemcitabine with oncology RTC March 2021 for 63-month surveillance cystoscopy  Nickolas Madrid, MD 01/03/2019

## 2019-01-08 NOTE — Progress Notes (Signed)
[place]  Telephone:([phone] [phone]  ID: Shawn Hardy. OB: 02/18/1945  MR#: [id number]  [id number]  Patient Care Team: [place] Pink, MD as PCP - General (Family Medicine)  CHIEF COMPLAINT: Noninvasive urothelial carcinoma.  INTERVAL HISTORY: Patient was last seen in clinic approximately 1 year ago.  He is referred back for consideration of repeat intravesical gemcitabine for noninvasive bladder cancer.  He continues to feel well and remains asymptomatic. He has no neurologic complaints.  He denies any recent fevers or illnesses.  He has a good appetite and denies weight loss.  He denies any chest pain, shortness of breath, cough, or hemoptysis.  He denies any nausea, vomiting, constipation, or diarrhea.  He has no urinary complaints.  Patient feels at his baseline offers no specific complaints today.  REVIEW OF SYSTEMS:   Review of Systems  Constitutional: Negative.  Negative for fever, malaise/fatigue and weight loss.  Respiratory: Negative.  Negative for cough, hemoptysis and shortness of breath.   Cardiovascular: Negative.  Negative for chest pain and leg swelling.  Gastrointestinal: Negative.  Negative for abdominal pain.  Genitourinary: Negative.  Negative for dysuria, frequency and hematuria.  Musculoskeletal: Negative.  Negative for back pain.  Skin: Negative.  Negative for rash.  Neurological: Negative.  Negative for focal weakness, weakness and headaches.  Psychiatric/Behavioral: Negative.  The patient is not nervous/anxious.     As per HPI. Otherwise, a complete review of systems is negative.  PAST MEDICAL HISTORY: Past Medical History:  Diagnosis Date  . Bladder cancer ([place])   . ED (erectile dysfunction)   . Glaucoma    left eye  . Hematuria   . Hyperlipidemia   . Hypertension   . Hypokalemia     PAST SURGICAL HISTORY: Past Surgical History:  Procedure Laterality Date  . COLONOSCOPY WITH PROPOFOL N/A 09/17/2016   Procedure: COLONOSCOPY WITH PROPOFOL;  Surgeon: Lollie Sails, MD;  Location: [facility] ENDOSCOPY;  Service: Endoscopy;  Laterality: N/A;  . EYE SURGERY Left    cataract extraction  . KNEE SURGERY Right 1980   arthroscopy  . TRANSURETHRAL RESECTION OF BLADDER TUMOR N/A 11/01/2017   Procedure: TRANSURETHRAL RESECTION OF BLADDER TUMOR (TURBT);  Surgeon: Billey Co, MD;  Location: [facility] ORS;  Service: Urology;  Laterality: N/A;  . TRANSURETHRAL RESECTION OF BLADDER TUMOR WITH MITOMYCIN-C N/A 12/11/2017   Procedure: TRANSURETHRAL RESECTION OF BLADDER TUMOR WITH gemcitabine;  Surgeon: Billey Co, MD;  Location: [facility] ORS;  Service: Urology;  Laterality: N/A;  . TRANSURETHRAL RESECTION OF BLADDER TUMOR WITH MITOMYCIN-C N/A 12/26/2018   Procedure: TRANSURETHRAL RESECTION OF BLADDER TUMOR WITH gemcitabine;  Surgeon: Billey Co, MD;  Location: [facility] ORS;  Service: Urology;  Laterality: N/A;    FAMILY HISTORY: Family History  Problem Relation Age of Onset  . Cirrhosis Father   . Heart attack Mother     ADVANCED DIRECTIVES (Y/N):  N  HEALTH MAINTENANCE: Social History   Tobacco Use  . Smoking status: Never Smoker  . Smokeless tobacco: Never Used  Substance Use Topics  . Alcohol use: Yes    Comment: "very little"  . Drug use: No     Colonoscopy:  PAP:  Bone density:  Lipid panel:  No Known Allergies  Current Outpatient Medications  Medication Sig Dispense Refill  . amLODipine (NORVASC) 10 MG tablet Take 10 mg by mouth daily after lunch.     . benazepril (LOTENSIN) 20 MG tablet Take 20 mg by mouth daily after lunch.     Marland Kitchen  brimonidine (ALPHAGAN) 0.2 % ophthalmic solution Place 3 drops into both eyes daily.     Marland Kitchen latanoprost (XALATAN) 0.005 % ophthalmic solution Place 1 drop into the right eye at bedtime.     . Multiple Vitamins-Minerals (CENTRUM SILVER PO) Take 1 tablet by mouth daily.    . potassium chloride (KLOR-CON) 10 MEQ tablet Take 20 mEq by mouth 2 (two) times  daily.    . timolol (TIMOPTIC) 0.5 % ophthalmic solution Place 2 drops into both eyes daily.   6  . triamterene-hydrochlorothiazide (DYAZIDE) 37.5-25 MG capsule Take 1 capsule by mouth daily after lunch.      No current facility-administered medications for this visit.    OBJECTIVE: Vitals:   01/12/19 0943 01/12/19 0945  BP: 123/78 (!) 151/88  Pulse: 80 83  Temp: (!) 97.4 F (36.3 C)      Body mass index is 26 kg/m.    ECOG FS:0 - Asymptomatic  General: Well-developed, well-nourished, no acute distress. Eyes: Pink conjunctiva, anicteric sclera. HEENT: Normocephalic, moist mucous membranes. Lungs: No audible wheezing or coughing. Heart: Regular rate and rhythm. Abdomen: Soft, nontender, no obvious distention. Musculoskeletal: No edema, cyanosis, or clubbing. Neuro: Alert, answering all questions appropriately. Cranial nerves grossly intact. Skin: No rashes or petechiae noted. Psych: Normal affect.   LAB RESULTS:  Lab Results  Component Value Date   NA 141 12/26/2018   K 3.5 12/26/2018   CL 104 12/26/2018   GLUCOSE 95 12/26/2018   BUN 17 12/26/2018   CREATININE 1.50 (H) 12/26/2018   GFRNONAA 44 (L) 09/10/2017   GFRAA 51 (L) 09/10/2017    Lab Results  Component Value Date   HGB 15.3 12/26/2018   HCT 45.0 12/26/2018     STUDIES: No results found.  ASSESSMENT: Noninvasive urothelial carcinoma.   PLAN:    1.  Noninvasive urothelial carcinoma: Patient is once again referred for 6 weekly cycles of 2000 mg intravesical gemcitabine.  Patient last received this treatment on March 12, 2018.  It also appears he received 1 dose of 2000 mg IV gemcitabine on December 26, 2018.  Return to clinic on January 28, 2019 to initiate cycle 1 of 6 of weekly treatment.  Urinalysis will be tested prior to each treatment to ensure there is no urinary tract infection.  Patient expressed understanding that any questions for laboratory work regarding his treatment or side effects should be  referred to his primary urologist.  No further interventions are needed.  No follow-up has been scheduled at this time.  Please refer patient back if there are any questions or concerns.  I spent a total of 25 minutes face-to-face with the patient and reviewing chart data of which greater than 50% of the visit was spent in counseling and coordination of care as detailed above.   Patient expressed understanding and was in agreement with this plan. He also understands that He can call clinic at any time with any questions, concerns, or complaints.    Lloyd Huger, MD   01/12/2019 10:30 AM

## 2019-01-09 ENCOUNTER — Other Ambulatory Visit: Payer: Self-pay

## 2019-01-09 NOTE — Progress Notes (Signed)
Patient pre screened for office appointment, no questions or concerns today. Patient reminded of upcoming appointment time and date. 

## 2019-01-12 ENCOUNTER — Other Ambulatory Visit: Payer: Self-pay

## 2019-01-12 ENCOUNTER — Inpatient Hospital Stay: Payer: Medicare Other | Attending: Oncology | Admitting: Oncology

## 2019-01-12 VITALS — BP 151/88 | HR 83 | Temp 97.4°F | Wt 171.0 lb

## 2019-01-12 DIAGNOSIS — Z79899 Other long term (current) drug therapy: Secondary | ICD-10-CM | POA: Diagnosis not present

## 2019-01-12 DIAGNOSIS — E785 Hyperlipidemia, unspecified: Secondary | ICD-10-CM | POA: Insufficient documentation

## 2019-01-12 DIAGNOSIS — C679 Malignant neoplasm of bladder, unspecified: Secondary | ICD-10-CM | POA: Diagnosis present

## 2019-01-12 DIAGNOSIS — C689 Malignant neoplasm of urinary organ, unspecified: Secondary | ICD-10-CM

## 2019-01-12 DIAGNOSIS — I10 Essential (primary) hypertension: Secondary | ICD-10-CM | POA: Diagnosis not present

## 2019-01-28 ENCOUNTER — Other Ambulatory Visit: Payer: Self-pay | Admitting: Oncology

## 2019-01-28 ENCOUNTER — Other Ambulatory Visit: Payer: Self-pay

## 2019-01-28 ENCOUNTER — Inpatient Hospital Stay: Payer: Medicare Other | Attending: Oncology

## 2019-01-28 VITALS — BP 155/85 | HR 77 | Temp 96.4°F | Resp 18 | Wt 170.2 lb

## 2019-01-28 DIAGNOSIS — Z5111 Encounter for antineoplastic chemotherapy: Secondary | ICD-10-CM | POA: Insufficient documentation

## 2019-01-28 DIAGNOSIS — C679 Malignant neoplasm of bladder, unspecified: Secondary | ICD-10-CM | POA: Diagnosis not present

## 2019-01-28 DIAGNOSIS — C689 Malignant neoplasm of urinary organ, unspecified: Secondary | ICD-10-CM

## 2019-01-28 LAB — URINALYSIS, COMPLETE (UACMP) WITH MICROSCOPIC
Bacteria, UA: NONE SEEN
Bilirubin Urine: NEGATIVE
Glucose, UA: NEGATIVE mg/dL
Hgb urine dipstick: NEGATIVE
Ketones, ur: NEGATIVE mg/dL
Nitrite: NEGATIVE
Protein, ur: NEGATIVE mg/dL
Specific Gravity, Urine: 1.013 (ref 1.005–1.030)
pH: 7 (ref 5.0–8.0)

## 2019-01-28 MED ORDER — GEMCITABINE CHEMO FOR BLADDER INSTILLATION 2000 MG
2000.0000 mg | Freq: Once | INTRAVENOUS | Status: AC
  Administered 2019-01-28: 11:00:00 2000 mg via INTRAVESICAL
  Filled 2019-01-28: qty 52.6

## 2019-01-28 MED ORDER — PROCHLORPERAZINE MALEATE 10 MG PO TABS
10.0000 mg | ORAL_TABLET | Freq: Once | ORAL | Status: AC
  Administered 2019-01-28: 10 mg via ORAL
  Filled 2019-01-28: qty 1

## 2019-01-28 NOTE — Progress Notes (Signed)
MD, Dr. Grayland Ormond, reviewed today's Urinalysis result. Per MD order: proceed with scheduled Gemzar treatment today.

## 2019-02-03 ENCOUNTER — Other Ambulatory Visit: Payer: Self-pay

## 2019-02-04 ENCOUNTER — Other Ambulatory Visit: Payer: Self-pay

## 2019-02-04 ENCOUNTER — Inpatient Hospital Stay: Payer: Medicare Other

## 2019-02-04 VITALS — BP 130/82 | HR 79 | Temp 96.8°F | Resp 20

## 2019-02-04 DIAGNOSIS — Z5111 Encounter for antineoplastic chemotherapy: Secondary | ICD-10-CM | POA: Diagnosis not present

## 2019-02-04 DIAGNOSIS — C689 Malignant neoplasm of urinary organ, unspecified: Secondary | ICD-10-CM

## 2019-02-04 LAB — URINALYSIS, COMPLETE (UACMP) WITH MICROSCOPIC
Bacteria, UA: NONE SEEN
Bilirubin Urine: NEGATIVE
Glucose, UA: NEGATIVE mg/dL
Hgb urine dipstick: NEGATIVE
Ketones, ur: NEGATIVE mg/dL
Leukocytes,Ua: NEGATIVE
Nitrite: NEGATIVE
Protein, ur: NEGATIVE mg/dL
Specific Gravity, Urine: 1.012 (ref 1.005–1.030)
pH: 7 (ref 5.0–8.0)

## 2019-02-04 MED ORDER — GEMCITABINE CHEMO FOR BLADDER INSTILLATION 2000 MG
2000.0000 mg | Freq: Once | INTRAVENOUS | Status: AC
  Administered 2019-02-04: 2000 mg via INTRAVESICAL
  Filled 2019-02-04: qty 52.6

## 2019-02-04 MED ORDER — PROCHLORPERAZINE MALEATE 10 MG PO TABS
10.0000 mg | ORAL_TABLET | Freq: Once | ORAL | Status: AC
  Administered 2019-02-04: 10 mg via ORAL
  Filled 2019-02-04: qty 1

## 2019-02-10 ENCOUNTER — Other Ambulatory Visit: Payer: Self-pay

## 2019-02-11 ENCOUNTER — Other Ambulatory Visit: Payer: Self-pay

## 2019-02-11 ENCOUNTER — Inpatient Hospital Stay: Payer: Medicare Other

## 2019-02-11 VITALS — BP 146/86 | HR 80 | Temp 96.1°F | Resp 18 | Wt 171.2 lb

## 2019-02-11 DIAGNOSIS — Z5111 Encounter for antineoplastic chemotherapy: Secondary | ICD-10-CM | POA: Diagnosis not present

## 2019-02-11 DIAGNOSIS — C689 Malignant neoplasm of urinary organ, unspecified: Secondary | ICD-10-CM

## 2019-02-11 LAB — URINALYSIS, COMPLETE (UACMP) WITH MICROSCOPIC
Bacteria, UA: NONE SEEN
Bilirubin Urine: NEGATIVE
Glucose, UA: NEGATIVE mg/dL
Hgb urine dipstick: NEGATIVE
Ketones, ur: NEGATIVE mg/dL
Leukocytes,Ua: NEGATIVE
Nitrite: NEGATIVE
Protein, ur: NEGATIVE mg/dL
Specific Gravity, Urine: 1.013 (ref 1.005–1.030)
Squamous Epithelial / HPF: NONE SEEN (ref 0–5)
pH: 7 (ref 5.0–8.0)

## 2019-02-11 MED ORDER — GEMCITABINE CHEMO FOR BLADDER INSTILLATION 2000 MG
2000.0000 mg | Freq: Once | INTRAVENOUS | Status: AC
  Administered 2019-02-11: 10:00:00 2000 mg via INTRAVESICAL
  Filled 2019-02-11: qty 52.6

## 2019-02-11 MED ORDER — PROCHLORPERAZINE MALEATE 10 MG PO TABS
10.0000 mg | ORAL_TABLET | Freq: Once | ORAL | Status: AC
  Administered 2019-02-11: 10 mg via ORAL
  Filled 2019-02-11: qty 1

## 2019-02-11 NOTE — Progress Notes (Signed)
Pt wanting to know if he can receive Covid vaccine while getting tx; Dr Grayland Ormond confirmed that it is OK to do so, pt informed and [place].

## 2019-02-18 ENCOUNTER — Other Ambulatory Visit: Payer: Self-pay

## 2019-02-18 ENCOUNTER — Inpatient Hospital Stay: Payer: Medicare Other

## 2019-02-18 VITALS — BP 137/78 | HR 70 | Temp 97.0°F | Resp 18

## 2019-02-18 DIAGNOSIS — Z5111 Encounter for antineoplastic chemotherapy: Secondary | ICD-10-CM | POA: Diagnosis not present

## 2019-02-18 DIAGNOSIS — C689 Malignant neoplasm of urinary organ, unspecified: Secondary | ICD-10-CM

## 2019-02-18 LAB — URINALYSIS, COMPLETE (UACMP) WITH MICROSCOPIC
Bacteria, UA: NONE SEEN
Bilirubin Urine: NEGATIVE
Glucose, UA: NEGATIVE mg/dL
Hgb urine dipstick: NEGATIVE
Ketones, ur: NEGATIVE mg/dL
Leukocytes,Ua: NEGATIVE
Nitrite: NEGATIVE
Protein, ur: NEGATIVE mg/dL
Specific Gravity, Urine: 1.013 (ref 1.005–1.030)
Squamous Epithelial / HPF: NONE SEEN (ref 0–5)
pH: 6 (ref 5.0–8.0)

## 2019-02-18 MED ORDER — GEMCITABINE CHEMO FOR BLADDER INSTILLATION 2000 MG
2000.0000 mg | Freq: Once | INTRAVENOUS | Status: AC
  Administered 2019-02-18: 2000 mg via INTRAVESICAL
  Filled 2019-02-18: qty 52.6

## 2019-02-18 MED ORDER — PROCHLORPERAZINE MALEATE 10 MG PO TABS
10.0000 mg | ORAL_TABLET | Freq: Once | ORAL | Status: AC
  Administered 2019-02-18: 10 mg via ORAL
  Filled 2019-02-18: qty 1

## 2019-02-25 ENCOUNTER — Other Ambulatory Visit: Payer: Self-pay

## 2019-02-25 ENCOUNTER — Inpatient Hospital Stay: Payer: Medicare Other | Attending: Oncology

## 2019-02-25 VITALS — BP 152/78 | HR 70 | Temp 97.0°F | Resp 18 | Wt 169.2 lb

## 2019-02-25 DIAGNOSIS — C679 Malignant neoplasm of bladder, unspecified: Secondary | ICD-10-CM | POA: Diagnosis not present

## 2019-02-25 DIAGNOSIS — Z5111 Encounter for antineoplastic chemotherapy: Secondary | ICD-10-CM | POA: Insufficient documentation

## 2019-02-25 DIAGNOSIS — C689 Malignant neoplasm of urinary organ, unspecified: Secondary | ICD-10-CM

## 2019-02-25 LAB — URINALYSIS, COMPLETE (UACMP) WITH MICROSCOPIC
Bacteria, UA: NONE SEEN
Bilirubin Urine: NEGATIVE
Glucose, UA: NEGATIVE mg/dL
Hgb urine dipstick: NEGATIVE
Ketones, ur: NEGATIVE mg/dL
Leukocytes,Ua: NEGATIVE
Nitrite: NEGATIVE
Protein, ur: NEGATIVE mg/dL
Specific Gravity, Urine: 1.008 (ref 1.005–1.030)
pH: 7 (ref 5.0–8.0)

## 2019-02-25 MED ORDER — LIDOCAINE HCL URETHRAL/MUCOSAL 2 % EX GEL: 1.0000 "application " | Freq: Once | CUTANEOUS | Status: DC

## 2019-02-25 MED ORDER — GEMCITABINE CHEMO FOR BLADDER INSTILLATION 2000 MG
2000.0000 mg | Freq: Once | INTRAVENOUS | Status: AC
  Administered 2019-02-25: 11:00:00 2000 mg via INTRAVESICAL
  Filled 2019-02-25: qty 52.6

## 2019-02-25 MED ORDER — BELLADONNA ALKALOIDS-OPIUM 16.2-60 MG RE SUPP: 1.0000 | Freq: Once | RECTAL | Status: DC | PRN

## 2019-02-25 MED ORDER — OXYBUTYNIN CHLORIDE 5 MG PO TABS: 5.0000 mg | ORAL_TABLET | Freq: Once | ORAL | Status: DC | PRN

## 2019-02-25 MED ORDER — PROCHLORPERAZINE MALEATE 10 MG PO TABS
10.0000 mg | ORAL_TABLET | Freq: Once | ORAL | Status: AC
  Administered 2019-02-25: 11:00:00 10 mg via ORAL
  Filled 2019-02-25: qty 1

## 2019-02-25 NOTE — Progress Notes (Signed)
UA approved per Dr Grayland Ormond, 47 F foley inserted without difficulty and draining well, pt tolerated procedure well. 1233foley unclamped and draining well, pt tolerated infusion well

## 2019-03-03 ENCOUNTER — Other Ambulatory Visit: Payer: Self-pay

## 2019-03-04 ENCOUNTER — Inpatient Hospital Stay: Payer: Medicare Other

## 2019-03-04 ENCOUNTER — Other Ambulatory Visit: Payer: Self-pay

## 2019-03-04 VITALS — BP 122/80 | HR 77 | Temp 96.7°F | Resp 18 | Wt 169.4 lb

## 2019-03-04 DIAGNOSIS — Z5111 Encounter for antineoplastic chemotherapy: Secondary | ICD-10-CM | POA: Diagnosis not present

## 2019-03-04 DIAGNOSIS — C689 Malignant neoplasm of urinary organ, unspecified: Secondary | ICD-10-CM

## 2019-03-04 LAB — URINALYSIS, COMPLETE (UACMP) WITH MICROSCOPIC
Bacteria, UA: NONE SEEN
Bilirubin Urine: NEGATIVE
Glucose, UA: NEGATIVE mg/dL
Hgb urine dipstick: NEGATIVE
Ketones, ur: NEGATIVE mg/dL
Leukocytes,Ua: NEGATIVE
Nitrite: NEGATIVE
Protein, ur: NEGATIVE mg/dL
Specific Gravity, Urine: 1.015 (ref 1.005–1.030)
Squamous Epithelial / HPF: NONE SEEN (ref 0–5)
pH: 6 (ref 5.0–8.0)

## 2019-03-04 MED ORDER — LIDOCAINE HCL URETHRAL/MUCOSAL 2 % EX GEL: 1.0000 "application " | Freq: Once | CUTANEOUS | Status: DC

## 2019-03-04 MED ORDER — GEMCITABINE CHEMO FOR BLADDER INSTILLATION 2000 MG
2000.0000 mg | Freq: Once | INTRAVENOUS | Status: AC
  Administered 2019-03-04: 2000 mg via INTRAVESICAL
  Filled 2019-03-04: qty 52.6

## 2019-03-04 MED ORDER — SODIUM CHLORIDE 0.9 % IV SOLN
Freq: Once | INTRAVENOUS | Status: DC
  Filled 2019-03-04: qty 250

## 2019-03-04 MED ORDER — PROCHLORPERAZINE MALEATE 10 MG PO TABS
10.0000 mg | ORAL_TABLET | Freq: Once | ORAL | Status: AC
  Administered 2019-03-04: 10:00:00 10 mg via ORAL
  Filled 2019-03-04: qty 1

## 2019-03-04 NOTE — Progress Notes (Signed)
Gemzar Bladder Instillation performed per protocol/MD order. Pt tolerated well. Pt denies any concerns at discharge. Pt stable at discharge.

## 2019-03-26 ENCOUNTER — Ambulatory Visit: Payer: Medicare Other | Admitting: Urology

## 2019-03-26 ENCOUNTER — Other Ambulatory Visit: Payer: Self-pay

## 2019-03-26 ENCOUNTER — Encounter: Payer: Self-pay | Admitting: Urology

## 2019-03-26 VITALS — BP 144/75 | HR 73 | Ht 65.0 in | Wt 171.1 lb

## 2019-03-26 DIAGNOSIS — Z8551 Personal history of malignant neoplasm of bladder: Secondary | ICD-10-CM

## 2019-03-26 LAB — URINALYSIS, COMPLETE
Bilirubin, UA: NEGATIVE
Glucose, UA: NEGATIVE
Ketones, UA: NEGATIVE
Leukocytes,UA: NEGATIVE
Nitrite, UA: NEGATIVE
RBC, UA: NEGATIVE
Specific Gravity, UA: 1.02 (ref 1.005–1.030)
Urobilinogen, Ur: 0.2 mg/dL (ref 0.2–1.0)
pH, UA: 7 (ref 5.0–7.5)

## 2019-03-26 LAB — MICROSCOPIC EXAMINATION
Bacteria, UA: NONE SEEN
RBC, Urine: NONE SEEN /hpf (ref 0–2)

## 2019-03-26 MED ORDER — LIDOCAINE HCL URETHRAL/MUCOSAL 2 % EX GEL
1.0000 "application " | Freq: Once | CUTANEOUS | Status: AC
  Administered 2019-03-26: 1 via URETHRAL

## 2019-03-26 MED ORDER — LIDOCAINE HCL 2 % IJ SOLN: 50.0000 mL | Freq: Once | INTRAMUSCULAR | Status: AC

## 2019-03-26 NOTE — Progress Notes (Signed)
Bladder Instillation  Due to bladder fulguration in office patient is present today for a Bladder Instillation of Lidocaine 2%. Patient was cleaned and prepped in a sterile fashion with betadine and lidocaine 2% jelly was instilled into the urethra.  A 14FR catheter was inserted, urine return was noted 55ml, urine was clear in color.  50 ml was instilled into the bladder. The catheter was then removed. Patient tolerated well, no complications were noted Patient held in bladder for 30 minutes prior to procedure starting.   Preformed by: Fonnie Jarvis, CMA

## 2019-03-26 NOTE — Progress Notes (Signed)
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 was positioned and prepped in the standard fashion. Cystoscopy was performed with a flexible cystoscope. The urethra, bladder neck and entire bladder was visualized in a standard fashion. Normal mucosa throughout, no abnormalities on retroflexion.   Findings: Normal cystoscopy  Assessment and Plan: RTC 6 mo surveillance cysto  Nickolas Madrid, MD 03/26/2019

## 2019-09-15 ENCOUNTER — Other Ambulatory Visit: Payer: Self-pay

## 2019-09-15 ENCOUNTER — Other Ambulatory Visit: Payer: Self-pay | Admitting: *Deleted

## 2019-09-15 ENCOUNTER — Other Ambulatory Visit: Payer: Medicare Other

## 2019-09-15 DIAGNOSIS — Z20822 Contact with and (suspected) exposure to covid-19: Secondary | ICD-10-CM

## 2019-09-16 LAB — NOVEL CORONAVIRUS, NAA: SARS-CoV-2, NAA: NOT DETECTED

## 2019-09-16 LAB — SARS-COV-2, NAA 2 DAY TAT

## 2019-10-01 ENCOUNTER — Ambulatory Visit: Payer: Medicare Other | Admitting: Urology

## 2019-10-01 ENCOUNTER — Encounter: Payer: Self-pay | Admitting: Urology

## 2019-10-01 ENCOUNTER — Other Ambulatory Visit: Payer: Self-pay

## 2019-10-01 VITALS — BP 156/91 | HR 77 | Ht 68.0 in | Wt 172.0 lb

## 2019-10-01 DIAGNOSIS — Z8551 Personal history of malignant neoplasm of bladder: Secondary | ICD-10-CM

## 2019-10-01 MED ORDER — SULFAMETHOXAZOLE-TRIMETHOPRIM 800-160 MG PO TABS: 1.0000 | ORAL_TABLET | Freq: Two times a day (BID) | ORAL | Status: DC

## 2019-10-01 MED ORDER — SULFAMETHOXAZOLE-TRIMETHOPRIM 800-160 MG PO TABS
1.0000 | ORAL_TABLET | Freq: Once | ORAL | Status: AC
  Administered 2019-10-01: 1 via ORAL

## 2019-10-01 MED ORDER — LIDOCAINE HCL 2 % IJ SOLN
50.0000 mL | Freq: Once | INTRAMUSCULAR | Status: AC
  Administered 2019-10-01: 1000 mg

## 2019-10-01 NOTE — Progress Notes (Signed)
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 was positioned and prepped in the standard fashion. Cystoscopy was performed with a flexible cystoscope.  The prostate was moderate in size with obstructing lateral lobes.  The urethra, bladder neck and entire bladder was visualized in a standard fashion. There was one small 1cm papillary tumor at the anterior wall.  Informed consent was obtained and he opted for in clinic fulguration.   Lidocaine was instilled into the bladder for 15 minutes. Bactrim was given for prophylaxis.  The cystoscope was reinserted, and the flexible Bugbee electrode was used to fulgurate the 1 cm papillary bladder lesion.  There was excellent hemostasis, and no residual papillary tumor seen.  Findings: Uncomplicated fulguration of small 1 cm lesion  Assessment and Plan: RTC 6 mo surveillance cysto  Nickolas Madrid, MD 10/01/2019

## 2019-10-01 NOTE — Patient Instructions (Signed)
Bladder Fulgeration, Care After This sheet gives you information about how to care for yourself after your procedure. Your health care provider may also give you more specific instructions. If you have problems or questions, contact your health care provider. What can I expect after the procedure? After the procedure, it is common to have:  Mild pain in your bladder or kidney area during urination.  Minor burning during urination.  Small amounts of blood in your urine.  A sudden urge to urinate.  A need to urinate more often than usual. Follow these instructions at home: Medicines  Take over-the-counter and prescription medicines only as told by your health care provider.  If you were prescribed an antibiotic medicine, take it as told by your health care provider. Do not stop taking the antibiotic even if you start to feel better. Activity  Rest if told by your health care provider.  Return to your normal activities as told by your health care provider. Ask your health care provider what activities are safe for you. General instructions   Take a warm bath to relieve any burning sensations around your urethra.  Hold a warm, damp washcloth over the urethral area to ease pain.  It is up to you to get the results of your procedure. Ask your health care provider, or the department that is doing the procedure, when your results will be ready.  Keep all follow-up visits as told by your health care provider. This is important. Contact a health care provider if:  You have a fever.  Your symptoms do not improve within 24 hours, and you continue to have: ? Burning during urination. ? Pain during urination. ? An urgent need to urinate. ? A need to urinate more often than usual. Get help right away if:  You have a lot of bleeding or more bleeding.  You have severe pain.  You are unable to urinate.  You are passing blood clots in your urine.  You have a  fever. Summary  After the procedure, it is common to have mild pain, burning with urination, and some blood.  Take medicines as told. If you were given antibiotics, finish all of it even if you start to feel better.  Rest after the procedure. Follow your health care provider's instructions for self care at home.  Contact a health care provider if your symptoms do not improve within 24 hours, or if you have more pain or more blood in your urine.  Get help right away if you have a lot of bleeding, severe pain, fever, or bright red blood or blood clots in the urine. This information is not intended to replace advice given to you by your health care provider. Make sure you discuss any questions you have with your health care provider. Document Revised: 07/16/2018 Document Reviewed: 07/16/2018 Elsevier Patient Education  [place].

## 2019-10-01 NOTE — Progress Notes (Signed)
Patient ID: Sidney Ace., male   DOB: 1945/12/24, 74 y.o.   MRN: [id number] Bladder Instillation  Due to bladder fulguration patient is present today for a Bladder Instillation of lidocaine2%. Patient was cleaned and prepped in a sterile fashion with betadine and lidocaine 2% jelly was instilled into the urethra.  A 14 coudeFR catheter was inserted, urine return was noted 26ml, urine was yellow in color.  50 ml of lidocaine 2% was instilled into the bladder. The catheter was then removed. Patient tolerated well, no complications were noted Patient held in bladder for 15 minutes prior to procedure starting.   Performed [id number] Tamala Julian, CMA/ Fonnie Jarvis CMA  Follow up/ Additional notes:

## 2019-12-05 IMAGING — CT CT ABD-PEL WO/W CM
2 of 12 series · 9 of 46 positions shown, 15 images · IV contrast (APPLIED)
Comparison: None.

CLINICAL DATA: 72-year-old male with history of lower abdominal
discomfort. Gross hematuria.

EXAM:
CT ABDOMEN AND PELVIS WITHOUT AND WITH CONTRAST
TECHNIQUE: Multidetector CT imaging of the abdomen and pelvis was performed
following the standard protocol before and following the bolus
administration of intravenous contrast.
CONTRAST:  125mL [id number] IOPAMIDOL ([id number]) INJECTION 61%

[Series 2: axial pre · axial · non-contrast · 0.74mm/px · z∈[+34,+394]mm · 7 of 97 slices shown, 12 images]
[im 13/97  soft-tissue]
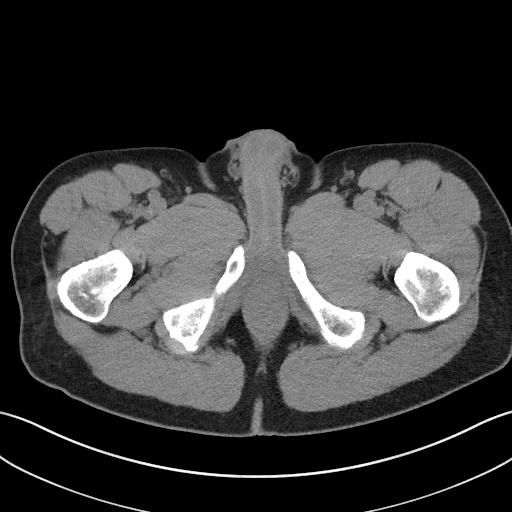
[im 13/97  bone]
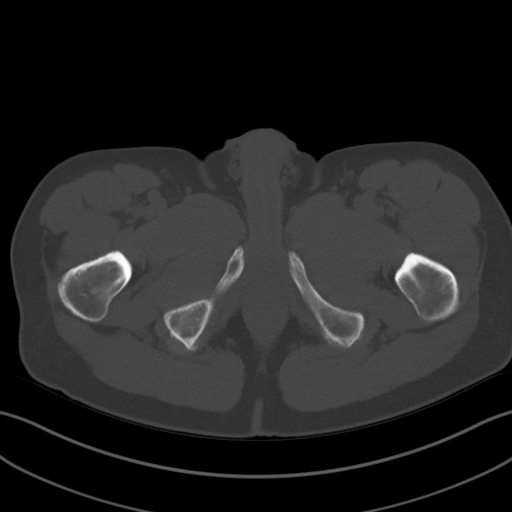
[im 25/97  soft-tissue]
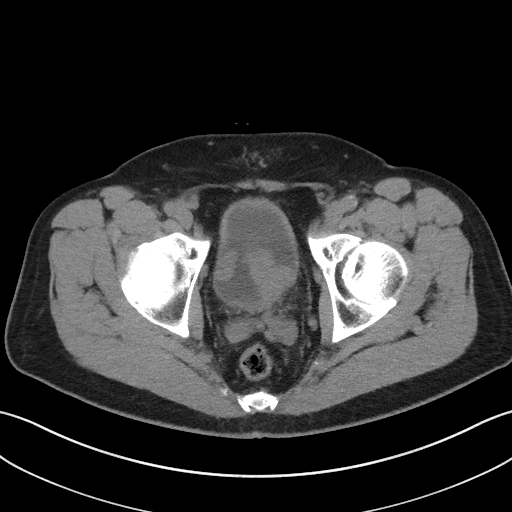
[im 37/97  soft-tissue]
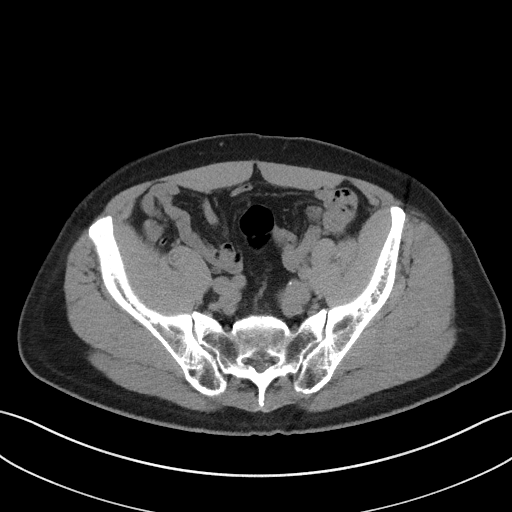
[im 49/97  soft-tissue]
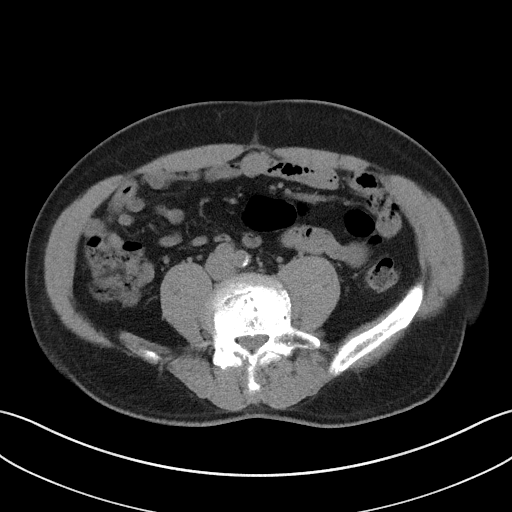
[im 49/97  lung]
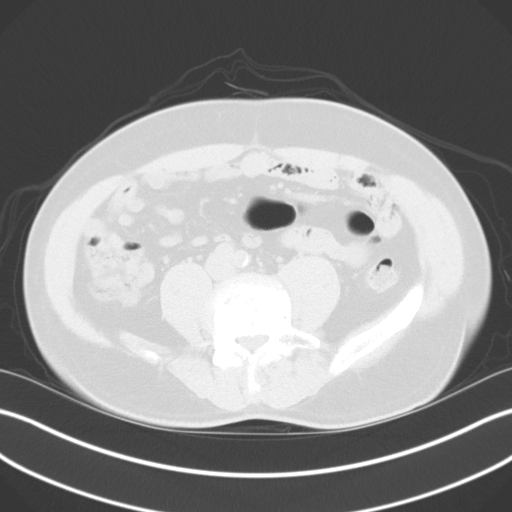
[im 61/97  soft-tissue]
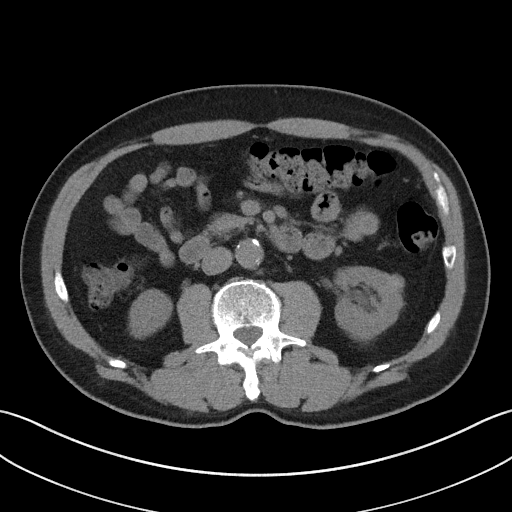
[im 61/97  lung]
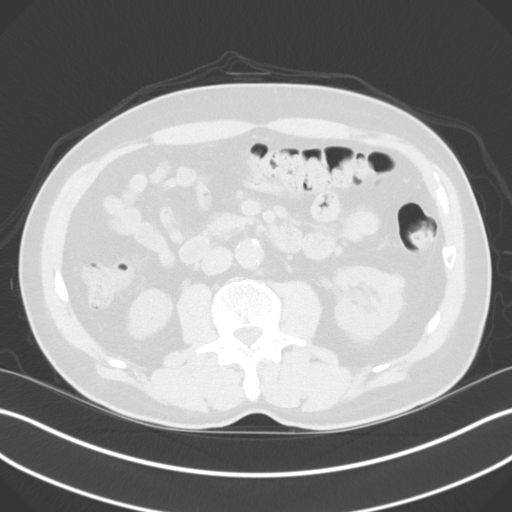
[im 73/97  soft-tissue]
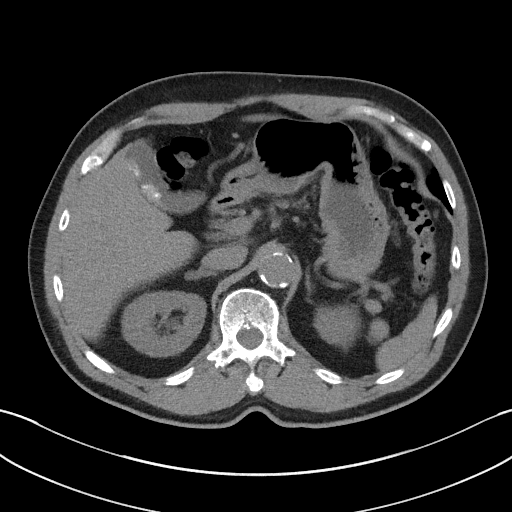
[im 73/97  lung]
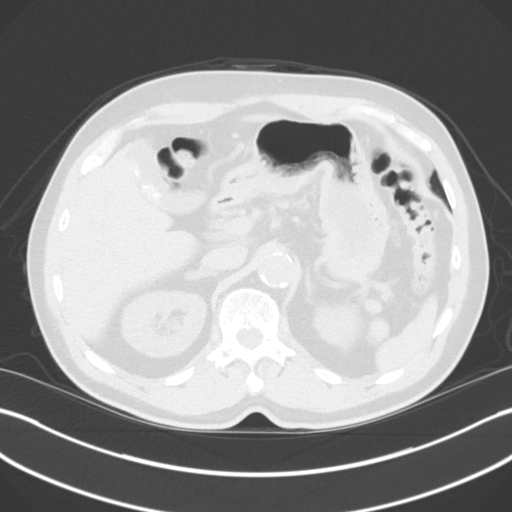
[im 85/97  soft-tissue]
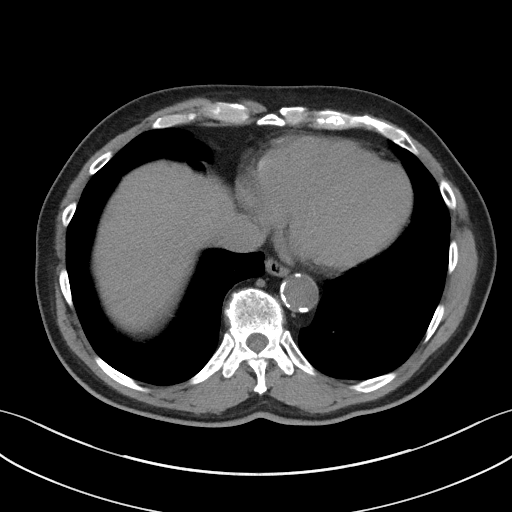
[im 85/97  lung]
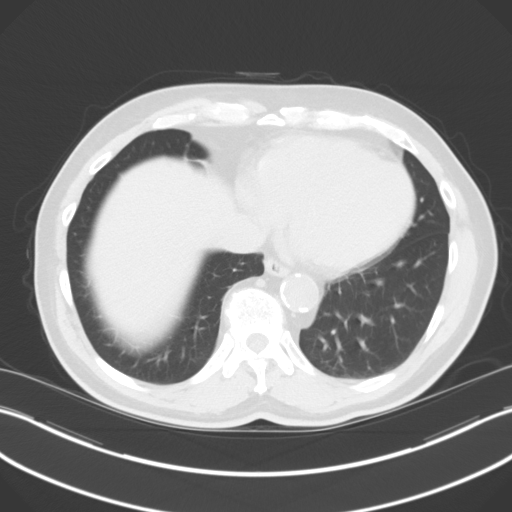

[Series 6: coronal pre · coronal · non-contrast · 0.75mm/px · 2 of 85 slices shown, 3 images]
[im 29/85  soft-tissue]
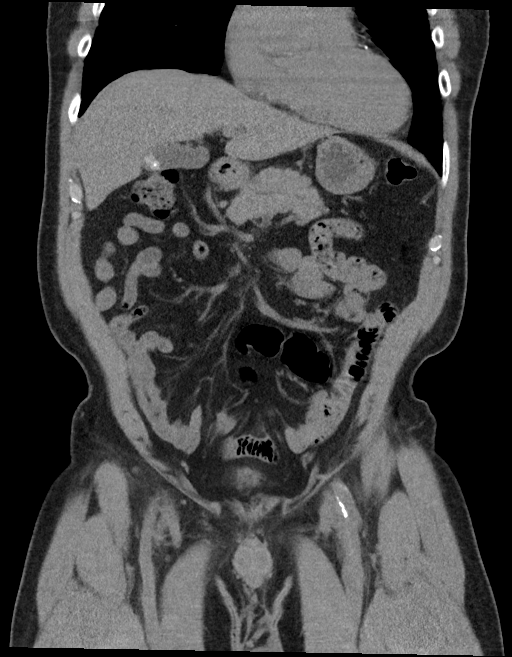
[im 29/85  bone]
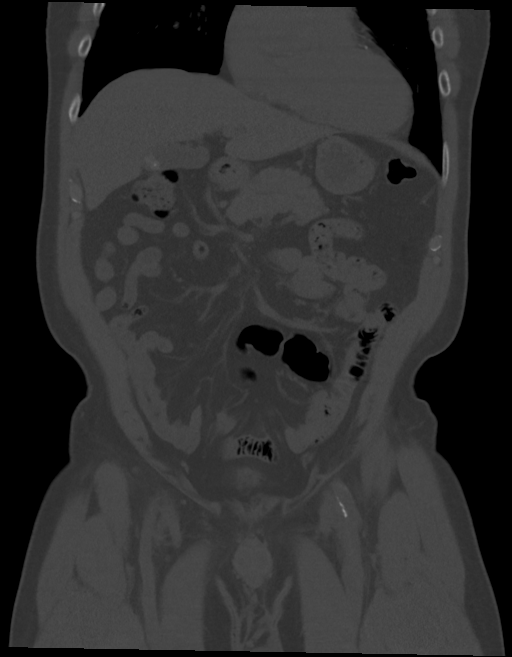
[im 57/85  soft-tissue]
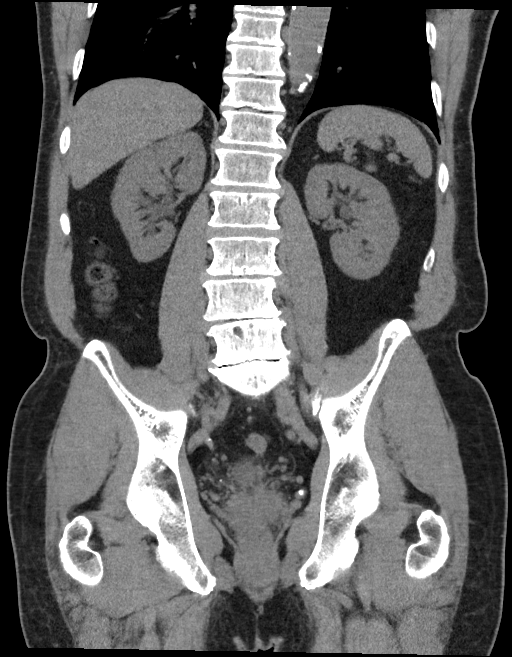

[9 of 46 positions shown; findings below may reference images not displayed]

FINDINGS: Lower chest: Atherosclerotic calcifications in the descending
thoracic aorta, as well as the left main, left anterior descending
and right coronary arteries. Calcifications of the aortic valve.

Hepatobiliary: 3 mm low-attenuation lesion in segment 4A of the
liver, too small to characterize, but statistically likely a tiny
cyst. No other larger more suspicious appearing cystic or solid
hepatic lesions. No intra or extrahepatic biliary ductal dilatation.
Partially calcified gallstones lying dependently in the gallbladder.
No signs of acute cholecystitis at this time.

Pancreas: No pancreatic mass. No pancreatic ductal dilatation. No
pancreatic or peripancreatic fluid or inflammatory changes.

Spleen: Unremarkable.

Adrenals/Urinary Tract: No calcifications are identified within the
collecting system of either kidney, along the course of either
ureter, or within the lumen of the urinary bladder. There is no
hydroureteronephrosis. In the kidneys bilaterally there multiple
low-attenuation lesions, largest of which are compatible with simple
cysts. The largest of these measures up to 2.7 cm in the interpolar
region of the left kidney posteriorly. In addition, in the lateral
aspect of the lower pole of the left kidney there is a 1.5 cm high
attenuation lesion which does not enhance, compatible with a
proteinaceous/hemorrhagic cyst. Several other subcentimeter
low-attenuation lesions in the kidneys are too small to definitively
characterize, but are statistically likely to represent cysts. In
the urinary bladder there are several enhancing soft tissue masses,
the largest of which is in the base and posterolateral aspect of the
urinary bladder on the left side (axial image 74 of series 4 and
coronal image 48 of series 10) measuring 4.7 x 3.3 x 4.7 cm.
Multiple other smaller lesions involve nearly all aspects of the
urinary bladder and appear separate from the largest lesion
described above. Postcontrast delayed axial images demonstrate no
definite filling defects within the collecting system of either
kidney or along the course of either ureter to suggest upper tract
urothelial neoplasm at this time. Bilateral adrenal glands are
normal in appearance.

Stomach/Bowel: Normal appearance of the stomach. No pathologic
dilatation of small bowel or colon. Normal appendix.

Vascular/Lymphatic: Aortic atherosclerosis, without evidence of
aneurysm or dissection in the abdominal or pelvic vasculature. No
lymphadenopathy noted in the abdomen or pelvis.

Reproductive: Prostate gland and seminal vesicles are unremarkable
in appearance.

Other: No significant volume of ascites.  No pneumoperitoneum.

Musculoskeletal: There are no aggressive appearing lytic or blastic
lesions noted in the visualized portions of the skeleton.
IMPRESSION: 1. Multiple urothelial lesions within the urinary bladder highly
concerning for multicentric transitional cell carcinoma of the
urinary bladder. No upper tract filling defects are noted at this
time.
2. No urinary tract calculi.
3. Multiple renal lesions appear to reflect a combination of simple
and proteinaceous/hemorrhagic cysts, as detailed above.
4. Cholelithiasis, without evidence of acute cholecystitis at this
time.
5. Aortic atherosclerosis, in addition to left main and 2 vessel
coronary artery disease. Assessment for potential risk factor
modification, dietary therapy or pharmacologic therapy may be
warranted, if clinically indicated.
6. There are calcifications of the aortic valve. Echocardiographic
correlation for evaluation of potential valvular dysfunction may be
warranted if clinically indicated.

## 2020-03-30 ENCOUNTER — Encounter: Payer: Self-pay | Admitting: Urology

## 2020-03-30 ENCOUNTER — Other Ambulatory Visit: Payer: Self-pay

## 2020-03-30 ENCOUNTER — Ambulatory Visit: Payer: Medicare Other | Admitting: Urology

## 2020-03-30 VITALS — BP 145/75 | HR 65 | Ht 68.0 in | Wt 170.0 lb

## 2020-03-30 DIAGNOSIS — C679 Malignant neoplasm of bladder, unspecified: Secondary | ICD-10-CM | POA: Diagnosis not present

## 2020-03-30 NOTE — Progress Notes (Signed)
Cystoscopy Procedure Note:  Indication:  Hx of LG Tapapillary urothelial cell carcinoma  TURBT 5cm 9/2019LG Ta, repeat TURBT 11/2017 LG Ta Induction gemcitabinex6 Cysto negative 02/2018 Recurrence multiple small papillary tumors 11/2018 TURBT 12/26/2019 LG Ta Repeat induction gemcitabinex6 Cysto negative 03/2019 Cysto 09/2019 with 1cm papillary lesion, in office fulguration  After informed consent and discussion of the procedure and its risks, Shawn Hardy. was positioned and prepped in the standard fashion. Cystoscopy was performed with a flexible cystoscope. The urethra, bladder neck and entire bladder was visualized in a standard fashion. The prostate was moderate in size.  Bladder mucosa grossly normal throughout with no evidence of recurrence, multiple bladder scars throughout from prior resections.  No abnormalities on retroflexion  Findings: No evidence of recurrence  Assessment and Plan: Continue q6 months surveillance cystoscopy for intermediate risk disease, consider induction BCG if recurrence in the future(was on shortage at time of original diagnosis)  Nickolas Madrid, MD 03/30/2020

## 2020-04-01 LAB — URINALYSIS, COMPLETE
Bilirubin, UA: NEGATIVE
Glucose, UA: NEGATIVE
Ketones, UA: NEGATIVE
Leukocytes,UA: NEGATIVE
Nitrite, UA: NEGATIVE
Protein,UA: NEGATIVE
RBC, UA: NEGATIVE
Specific Gravity, UA: 1.02 (ref 1.005–1.030)
Urobilinogen, Ur: 0.2 mg/dL (ref 0.2–1.0)
pH, UA: 7 (ref 5.0–7.5)

## 2020-04-01 LAB — MICROSCOPIC EXAMINATION: Bacteria, UA: NONE SEEN

## 2020-05-09 ENCOUNTER — Other Ambulatory Visit (HOSPITAL_BASED_OUTPATIENT_CLINIC_OR_DEPARTMENT_OTHER): Payer: Self-pay

## 2020-05-10 ENCOUNTER — Other Ambulatory Visit (HOSPITAL_BASED_OUTPATIENT_CLINIC_OR_DEPARTMENT_OTHER): Payer: Self-pay

## 2020-05-10 ENCOUNTER — Ambulatory Visit: Payer: Medicare Other | Attending: Internal Medicine

## 2020-05-10 ENCOUNTER — Other Ambulatory Visit: Payer: Self-pay

## 2020-05-10 DIAGNOSIS — Z23 Encounter for immunization: Secondary | ICD-10-CM

## 2020-05-10 MED ORDER — COVID-19 MRNA VACC (MODERNA) 100 MCG/0.5ML IM SUSP
INTRAMUSCULAR | 0 refills | Status: DC
  Filled 2020-05-10: qty 0.3, 1d supply, fill #0

## 2020-05-10 NOTE — Progress Notes (Signed)
   [id number]  Name:  Shawn Hardy.    MRN: [id number] DOB: [birth year]  05/10/2020  Mr. Girouard was observed post Covid-19 immunization for 15 minutes without incident. He was provided with Vaccine Information Sheet and instruction to access the V-Safe system.   Mr. Mazzocco was instructed to call 911 with any severe reactions post vaccine: Marland Kitchen Difficulty breathing  . Swelling of face and throat  . A fast heartbeat  . A bad rash all over body  . Dizziness and weakness   Immunizations Administered    Name Date Dose VIS Date Route   Moderna Covid-19 Booster Vaccine 05/10/2020  9:44 AM 0.25 mL 11/11/2019 Intramuscular   Manufacturer: Moderna   Lot: [id number]   [place]: 14445-848-35

## 2020-09-29 ENCOUNTER — Other Ambulatory Visit: Payer: Self-pay

## 2020-09-29 ENCOUNTER — Encounter: Payer: Self-pay | Admitting: Urology

## 2020-09-29 ENCOUNTER — Ambulatory Visit: Payer: Medicare Other | Admitting: Urology

## 2020-09-29 VITALS — BP 141/81 | HR 67 | Ht 68.0 in | Wt 160.0 lb

## 2020-09-29 DIAGNOSIS — Z8551 Personal history of malignant neoplasm of bladder: Secondary | ICD-10-CM | POA: Diagnosis not present

## 2020-09-29 NOTE — Progress Notes (Signed)
Cystoscopy Procedure Note:   Indication:  Hx of LG Ta papillary urothelial cell carcinoma   TURBT 5cm 09/2017 LG Ta, repeat TURBT 11/2017 LG Ta Induction gemcitabine x6 Cysto negative 02/2018 Recurrence multiple small papillary tumors 11/2018 TURBT 12/26/2019 LG Ta Repeat induction gemcitabine x6 Cysto negative 03/2019 Cysto 09/2019 with 1cm papillary lesion, in office fulguration   After informed consent and discussion of the procedure and its risks, Shawn Hupe. was positioned and prepped in the standard fashion. Cystoscopy was performed with a flexible cystoscope. The urethra, bladder neck and entire bladder was visualized in a standard fashion. The prostate was moderate in size.  There is a tiny 3 to 4 mm papillary recurrence at the posterior right bladder wall.  Informed consent was obtained, and he opted for in office fulguration.  The Bugbee was advanced through the cystoscope, and the tiny lesion was completely obliterated/fulgurated with the Bugbee.  Excellent hemostasis.   Findings: Small 10m papillary recurrence, fulgurated in office with Bugbee   Assessment and Plan: RTC 637-monthurveillance cystoscopy   BrNickolas MadridMD 09/29/2020

## 2020-11-11 ENCOUNTER — Other Ambulatory Visit: Payer: Self-pay

## 2020-11-11 ENCOUNTER — Ambulatory Visit: Payer: Medicare Other | Attending: Internal Medicine

## 2020-11-11 DIAGNOSIS — Z23 Encounter for immunization: Secondary | ICD-10-CM

## 2020-11-11 MED ORDER — MODERNA COVID-19 BIVAL BOOSTER 50 MCG/0.5ML IM SUSP
INTRAMUSCULAR | 0 refills | Status: DC
  Filled 2020-11-11: qty 0.5, 1d supply, fill #0

## 2020-11-11 NOTE — Progress Notes (Signed)
   [id number]  Name:  Shawn Hardy.    MRN: [id number] DOB: 1945/06/09  11/11/2020  Mr. Shawn Hardy was observed post Covid-19 immunization for 15 minutes without incident. He was provided with Vaccine Information Sheet and instruction to access the V-Safe system.   Mr. Shawn Hardy was instructed to call 911 with any severe reactions post vaccine: Difficulty breathing  Swelling of face and throat  A fast heartbeat  A bad rash all over body  Dizziness and weakness   Immunizations Administered     Name Date Dose VIS Date Route   Moderna Covid-19 vaccine Bivalent Booster 11/11/2020  9:02 AM 0.5 mL 09/03/2020 Intramuscular   Manufacturer: Levan Hurst   Lot: [id number]   NDC: [place], PharmD, MBA Clinical Acute Care Pharmacist

## 2021-03-22 ENCOUNTER — Encounter: Payer: Self-pay | Admitting: Oncology

## 2021-03-24 ENCOUNTER — Encounter: Payer: Self-pay | Admitting: Oncology

## 2021-03-29 ENCOUNTER — Encounter: Payer: Self-pay | Admitting: Urology

## 2021-03-29 ENCOUNTER — Encounter: Payer: Self-pay | Admitting: Oncology

## 2021-03-29 ENCOUNTER — Ambulatory Visit: Payer: Medicare Other | Admitting: Urology

## 2021-03-29 ENCOUNTER — Other Ambulatory Visit: Payer: Self-pay

## 2021-03-29 VITALS — BP 108/68 | HR 78 | Ht 65.5 in | Wt 158.6 lb

## 2021-03-29 DIAGNOSIS — Z8551 Personal history of malignant neoplasm of bladder: Secondary | ICD-10-CM

## 2021-03-29 NOTE — Patient Instructions (Signed)
Prostate Cancer Screening ?Prostate cancer screening is testing that is done to check for the presence of prostate cancer in men. The prostate gland is a walnut-sized gland that is located below the bladder and in front of the rectum in males. The function of the prostate is to add fluid to semen during ejaculation. Prostate cancer is one of the most common types of cancer in men. ?Who should have prostate cancer screening? ?Screening recommendations vary based on age and other risk factors, as well as between the professional organizations who make the recommendations. ?In general, screening is recommended if: ?You are age 50 to 70 and have an average risk for prostate cancer. You should talk with your health care provider about your need for screening and how often screening should be done. Because most prostate cancers are slow growing and will not cause death, screening in this age group is generally reserved for men who have a 10- to 15-year life expectancy. ?You are younger than age 50, and you have these risk factors: ?Having a father, brother, or uncle who has been diagnosed with prostate cancer. The risk is higher if your family member's cancer occurred at an early age or if you have multiple family members with prostate cancer at an early age. ?Being a male who is Black or is of Caribbean or sub-Saharan African descent. ?In general, screening is not recommended if: ?You are younger than age 40. ?You are between the ages of 40 and 49 and you have no risk factors. ?You are 77 years of age or older. At this age, the risks that screening can cause are greater than the benefits that it may provide. ?If you are at high risk for prostate cancer, your health care provider may recommend that you have screenings more often or that you start screening at a younger age. ?How is screening for prostate cancer done? ?The recommended prostate cancer screening test is a blood test called the prostate-specific antigen (PSA)  test. PSA is a protein that is made in the prostate. As you age, your prostate naturally produces more PSA. Abnormally high PSA levels may be caused by: ?Prostate cancer. ?An enlarged prostate that is not caused by cancer (benign prostatic hyperplasia, or BPH). This condition is very common in older men. ?A prostate gland infection (prostatitis) or urinary tract infection. ?Certain medicines such as male hormones (like testosterone) or other medicines that raise testosterone levels. ?A rectal exam may be done as part of prostate cancer screening to help provide information about the size of your prostate gland. When a rectal exam is performed, it should be done after the PSA level is drawn to avoid any effect on the results. ?Depending on the PSA results, you may need more tests, such as: ?A physical exam to check the size of your prostate gland, if not done as part of screening. ?Blood and imaging tests. ?A procedure to remove tissue samples from your prostate gland for testing (biopsy). This is the only way to know for certain if you have prostate cancer. ?What are the benefits of prostate cancer screening? ?Screening can help to identify cancer at an early stage, before symptoms start and when the cancer can be treated more easily. ?There is a small chance that screening may lower your risk of dying from prostate cancer. The chance is small because prostate cancer is a slow-growing cancer, and most men with prostate cancer die from a different cause. ?What are the risks of prostate cancer screening? ?The main   risk of prostate cancer screening is diagnosing and treating prostate cancer that would never have caused any symptoms or problems. This is called overdiagnosisand overtreatment. PSA screening cannot tell you if your PSA is high due to cancer or a different cause. A prostate biopsy is the only procedure to diagnose prostate cancer. Even the results of a biopsy may not tell you if your cancer needs to be  treated. Slow-growing prostate cancer may not need any treatment other than monitoring, so diagnosing and treating it may cause unnecessary stress or other side effects. ?Questions to ask your health care provider ?When should I start prostate cancer screening? ?What is my risk for prostate cancer? ?How often do I need screening? ?What type of screening tests do I need? ?How do I get my test results? ?What do my results mean? ?Do I need treatment? ?Where to find more information ?The American Cancer Society: www.cancer.org ?American Urological Association: www.auanet.org ?Contact a health care provider if: ?You have difficulty urinating. ?You have pain when you urinate or ejaculate. ?You have blood in your urine or semen. ?You have pain in your back or in the area of your prostate. ?Summary ?Prostate cancer is a common type of cancer in men. The prostate gland is located below the bladder and in front of the rectum. This gland adds fluid to semen during ejaculation. ?Prostate cancer screening may identify cancer at an early stage, when the cancer can be treated more easily and is less likely to have spread to other areas of the body. ?The prostate-specific antigen (PSA) test is the recommended screening test for prostate cancer, but it has associated risks. ?Discuss the risks and benefits of prostate cancer screening with your health care provider. If you are age 77 or older, the risks that screening can cause are greater than the benefits that it may provide. ?This information is not intended to replace advice given to you by your health care provider. Make sure you discuss any questions you have with your health care provider. ?Document Revised: 07/04/2020 Document Reviewed: 07/04/2020 ?Elsevier Patient Education ? 2022 Elsevier Inc. ? ?

## 2021-03-29 NOTE — Progress Notes (Signed)
Cystoscopy Procedure Note: ?  ?Indication:  ?Hx of LG Ta papillary urothelial cell carcinoma ?  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  ?After informed consent and discussion of the procedure and its risks, Shawn Hardy was positioned and prepped in the standard fashion. Cystoscopy was performed with a flexible cystoscope. The urethra, bladder neck and entire bladder was visualized in a standard fashion. The prostate was moderate in size.  The bladder was normal with no evidence of recurrence.  No abnormalities on retroflexion. ? ?  ?Findings: ?No evidence of recurrence ? ?Assessment and Plan: ?RTC 610-monthurveillance cystoscopy ? ?He also reports some weaker urinary stream and some sensation of incomplete emptying.  We discussed options including a trial of Flomax but he would like to hold off at this time.  He has noted some improvement by cutting back on sweet tea.  He also has questions about PSA screening, and most recent PSA in October 2022 was normal at 2.44.  We reviewed the AUA guidelines that do not recommend routine screening in men over age 9846and I discouraged him from ongoing PSA testing.  Risks and benefits discussed. ? ? ?BrNickolas MadridMD ?03/29/2021 ? ?

## 2021-04-10 ENCOUNTER — Encounter: Payer: Self-pay | Admitting: Oncology

## 2021-10-04 ENCOUNTER — Ambulatory Visit: Payer: Medicare Other | Admitting: Urology

## 2021-10-04 ENCOUNTER — Encounter: Payer: Self-pay | Admitting: Urology

## 2021-10-04 VITALS — BP 156/80 | HR 72 | Ht 68.0 in | Wt 154.0 lb

## 2021-10-04 DIAGNOSIS — C679 Malignant neoplasm of bladder, unspecified: Secondary | ICD-10-CM

## 2021-10-04 DIAGNOSIS — Z8551 Personal history of malignant neoplasm of bladder: Secondary | ICD-10-CM

## 2021-10-04 LAB — URINALYSIS, COMPLETE
Bilirubin, UA: NEGATIVE
Glucose, UA: NEGATIVE
Ketones, UA: NEGATIVE
Leukocytes,UA: NEGATIVE
Nitrite, UA: NEGATIVE
RBC, UA: NEGATIVE
Specific Gravity, UA: 1.015 (ref 1.005–1.030)
Urobilinogen, Ur: 0.2 mg/dL (ref 0.2–1.0)
pH, UA: 7 (ref 5.0–7.5)

## 2021-10-04 LAB — MICROSCOPIC EXAMINATION: Bacteria, UA: NONE SEEN

## 2021-10-04 NOTE — Progress Notes (Signed)
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 Cystoscopy was performed with a flexible cystoscope. The urethra, bladder neck and entire bladder was visualized in a standard fashion. The prostate was moderate in size.  The bladder was normal with no evidence of recurrence.  No abnormalities on retroflexion.  Findings: No evidence of recurrence  Assessment and Plan: RTC 656-monthurveillance cystoscopy, if negative consider spacing to yearly    BrNickolas MadridMD 10/04/2021

## 2022-04-05 ENCOUNTER — Ambulatory Visit: Payer: Medicare Other | Admitting: Urology

## 2022-04-05 VITALS — BP 135/70 | HR 68 | Ht 68.0 in | Wt 154.0 lb

## 2022-04-05 DIAGNOSIS — Z8551 Personal history of malignant neoplasm of bladder: Secondary | ICD-10-CM

## 2022-04-05 NOTE — Progress Notes (Signed)
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 Cystoscopy was performed with a flexible cystoscope. The urethra, bladder neck and entire bladder was visualized in a standard fashion. The prostate was moderate in size.  The bladder was normal with no evidence of recurrence.  No abnormalities on retroflexion.  Findings: No evidence of recurrence  Assessment and Plan: RTC 1 year cystoscopy    BNickolas Madrid MD 04/05/2022

## 2023-04-04 ENCOUNTER — Ambulatory Visit: Payer: Medicare Other | Admitting: Urology

## 2023-04-04 VITALS — BP 115/58 | HR 89 | Ht 66.0 in | Wt 162.0 lb

## 2023-04-04 DIAGNOSIS — Z8551 Personal history of malignant neoplasm of bladder: Secondary | ICD-10-CM | POA: Diagnosis not present

## 2023-04-04 DIAGNOSIS — C689 Malignant neoplasm of urinary organ, unspecified: Secondary | ICD-10-CM

## 2023-04-04 NOTE — Progress Notes (Signed)
 Cystoscopy Procedure Note:   Indication:  Hx of LG Ta papillary urothelial cell carcinoma   Prior treatments: -TURBT 5cm 09/2017 LG Ta, repeat TURBT 11/2017 LG Ta Induction gemcitabine x6  -Cysto negative 02/2018 -Recurrence multiple small papillary tumors 11/2018 -TURBT 12/26/2019 LG Ta -Repeat induction gemcitabine x6 -Cysto negative 03/2019 -Cysto 09/2019 with 1cm papillary lesion, in office fulguration -Cysto 09/2020 with 4mm papillary lesion in office fulguration   After informed consent and discussion of the procedure and its risks, Shawn Hardy. was positioned and prepped in the standard fashion. Cystoscopy was performed with a flexible cystoscope. The urethra, bladder neck and entire bladder was visualized in a standard fashion. The prostate was moderate in size.  The bladder was normal with no evidence of recurrence.  No abnormalities on retroflexion.  Findings: No evidence of recurrence  Assessment and Plan: RTC 1 year cystoscopy, consider spacing to Q 18 months per patient request at that time    Legrand Rams, MD 04/04/2023

## 2023-09-19 ENCOUNTER — Encounter: Payer: Self-pay | Admitting: Urology

## 2024-04-09 ENCOUNTER — Other Ambulatory Visit: Admitting: Urology

## 2024-04-15 ENCOUNTER — Other Ambulatory Visit: Admitting: Urology
